# Patient Record
Sex: Female | Born: 1985 | Marital: Married | Smoking: Former smoker
Health system: Southern US, Community
[De-identification: ages and names within clinical notes are randomized; demographics above are authoritative.]

## PROBLEM LIST (undated history)

## (undated) DIAGNOSIS — Z8619 Personal history of other infectious and parasitic diseases: Secondary | ICD-10-CM

## (undated) DIAGNOSIS — F329 Major depressive disorder, single episode, unspecified: Secondary | ICD-10-CM

## (undated) DIAGNOSIS — A499 Bacterial infection, unspecified: Secondary | ICD-10-CM

## (undated) DIAGNOSIS — B379 Candidiasis, unspecified: Secondary | ICD-10-CM

## (undated) DIAGNOSIS — F32A Depression, unspecified: Secondary | ICD-10-CM

## (undated) HISTORY — DX: Bacterial infection, unspecified: A49.9

## (undated) HISTORY — DX: Depression, unspecified: F32.A

## (undated) HISTORY — DX: Candidiasis, unspecified: B37.9

## (undated) HISTORY — PX: WISDOM TOOTH EXTRACTION: SHX21

## (undated) HISTORY — DX: Major depressive disorder, single episode, unspecified: F32.9

## (undated) HISTORY — DX: Personal history of other infectious and parasitic diseases: Z86.19

---

## 2011-01-12 ENCOUNTER — Other Ambulatory Visit (HOSPITAL_COMMUNITY)
Admission: RE | Admit: 2011-01-12 | Discharge: 2011-01-12 | Disposition: A | Discharge status: Home / Self Care | Payer: BC Managed Care – PPO | Source: Ambulatory Visit | Attending: Internal Medicine | Admitting: Internal Medicine

## 2011-01-12 DIAGNOSIS — Z01419 Encounter for gynecological examination (general) (routine) without abnormal findings: Secondary | ICD-10-CM | POA: Insufficient documentation

## 2011-10-28 ENCOUNTER — Other Ambulatory Visit: Payer: Self-pay | Admitting: Obstetrics and Gynecology

## 2011-10-28 ENCOUNTER — Ambulatory Visit (HOSPITAL_COMMUNITY): Payer: BC Managed Care – PPO

## 2011-10-28 ENCOUNTER — Other Ambulatory Visit: Payer: Self-pay

## 2011-10-28 ENCOUNTER — Encounter (INDEPENDENT_AMBULATORY_CARE_PROVIDER_SITE_OTHER): Payer: BC Managed Care – PPO | Admitting: Obstetrics and Gynecology

## 2011-10-28 DIAGNOSIS — Z3689 Encounter for other specified antenatal screening: Secondary | ICD-10-CM

## 2011-10-28 DIAGNOSIS — Z3480 Encounter for supervision of other normal pregnancy, unspecified trimester: Secondary | ICD-10-CM

## 2011-10-29 ENCOUNTER — Encounter (HOSPITAL_COMMUNITY): Payer: Self-pay

## 2011-10-29 ENCOUNTER — Ambulatory Visit (HOSPITAL_COMMUNITY)
Admission: RE | Admit: 2011-10-29 | Discharge: 2011-10-29 | Disposition: A | Discharge status: Home / Self Care | Payer: BC Managed Care – PPO | Source: Ambulatory Visit | Attending: Obstetrics and Gynecology | Admitting: Obstetrics and Gynecology

## 2011-10-29 ENCOUNTER — Ambulatory Visit (HOSPITAL_COMMUNITY): Admission: RE | Admit: 2011-10-29 | Payer: BC Managed Care – PPO | Source: Ambulatory Visit

## 2011-10-29 DIAGNOSIS — O358XX0 Maternal care for other (suspected) fetal abnormality and damage, not applicable or unspecified: Secondary | ICD-10-CM | POA: Insufficient documentation

## 2011-10-29 DIAGNOSIS — Z363 Encounter for antenatal screening for malformations: Secondary | ICD-10-CM | POA: Insufficient documentation

## 2011-10-29 DIAGNOSIS — Z1389 Encounter for screening for other disorder: Secondary | ICD-10-CM | POA: Insufficient documentation

## 2011-10-29 DIAGNOSIS — Z3689 Encounter for other specified antenatal screening: Secondary | ICD-10-CM

## 2011-11-13 DIAGNOSIS — F32A Depression, unspecified: Secondary | ICD-10-CM

## 2011-11-13 DIAGNOSIS — O26851 Spotting complicating pregnancy, first trimester: Secondary | ICD-10-CM | POA: Insufficient documentation

## 2011-11-13 DIAGNOSIS — F329 Major depressive disorder, single episode, unspecified: Secondary | ICD-10-CM | POA: Insufficient documentation

## 2011-11-25 ENCOUNTER — Encounter: Payer: BC Managed Care – PPO | Admitting: Obstetrics and Gynecology

## 2011-11-26 ENCOUNTER — Encounter: Payer: Self-pay | Admitting: Obstetrics and Gynecology

## 2011-11-26 ENCOUNTER — Ambulatory Visit (INDEPENDENT_AMBULATORY_CARE_PROVIDER_SITE_OTHER): Payer: BC Managed Care – PPO | Admitting: Obstetrics and Gynecology

## 2011-11-26 VITALS — BP 122/70 | Ht 64.0 in | Wt 183.0 lb

## 2011-11-26 DIAGNOSIS — Z331 Pregnant state, incidental: Secondary | ICD-10-CM

## 2011-11-26 NOTE — Patient Instructions (Signed)
Fetal Movement Counts Patient Name: __________________________________________________ Patient Due Date: ____________________ Kick counts is highly recommended in high risk pregnancies, but it is a good idea for every pregnant woman to do. Start counting fetal movements at 28 weeks of the pregnancy. Fetal movements increase after eating a full meal or eating or drinking something sweet (the blood sugar is higher). It is also important to drink plenty of fluids (well hydrated) before doing the count. Lie on your left side because it helps with the circulation or you can sit in a comfortable chair with your arms over your belly (abdomen) with no distractions around you. DOING THE COUNT  Try to do the count the same time of day each time you do it.   Mark the day and time, then see how long it takes for you to feel 10 movements (kicks, flutters, swishes, rolls). You should have at least 10 movements within 2 hours. You will most likely feel 10 movements in much less than 2 hours. If you do not, wait an hour and count again. After a couple of days you will see a pattern.   What you are looking for is a change in the pattern or not enough counts in 2 hours. Is it taking longer in time to reach 10 movements?  SEEK MEDICAL CARE IF:  You feel less than 10 counts in 2 hours. Tried twice.   No movement in one hour.   The pattern is changing or taking longer each day to reach 10 counts in 2 hours.   You feel the baby is not moving as it usually does.  Date: ____________ Movements: ____________ Start time: ____________ Finish time: ____________  Date: ____________ Movements: ____________ Start time: ____________ Finish time: ____________ Date: ____________ Movements: ____________ Start time: ____________ Finish time: ____________ Date: ____________ Movements: ____________ Start time: ____________ Finish time: ____________ Date: ____________ Movements: ____________ Start time: ____________ Finish time:  ____________ Date: ____________ Movements: ____________ Start time: ____________ Finish time: ____________ Date: ____________ Movements: ____________ Start time: ____________ Finish time: ____________ Date: ____________ Movements: ____________ Start time: ____________ Finish time: ____________  Date: ____________ Movements: ____________ Start time: ____________ Finish time: ____________ Date: ____________ Movements: ____________ Start time: ____________ Finish time: ____________ Date: ____________ Movements: ____________ Start time: ____________ Finish time: ____________ Date: ____________ Movements: ____________ Start time: ____________ Finish time: ____________ Date: ____________ Movements: ____________ Start time: ____________ Finish time: ____________ Date: ____________ Movements: ____________ Start time: ____________ Finish time: ____________ Date: ____________ Movements: ____________ Start time: ____________ Finish time: ____________  Date: ____________ Movements: ____________ Start time: ____________ Finish time: ____________ Date: ____________ Movements: ____________ Start time: ____________ Finish time: ____________ Date: ____________ Movements: ____________ Start time: ____________ Finish time: ____________ Date: ____________ Movements: ____________ Start time: ____________ Finish time: ____________ Date: ____________ Movements: ____________ Start time: ____________ Finish time: ____________ Date: ____________ Movements: ____________ Start time: ____________ Finish time: ____________ Date: ____________ Movements: ____________ Start time: ____________ Finish time: ____________  Date: ____________ Movements: ____________ Start time: ____________ Finish time: ____________ Date: ____________ Movements: ____________ Start time: ____________ Finish time: ____________ Date: ____________ Movements: ____________ Start time: ____________ Finish time: ____________ Date: ____________ Movements:  ____________ Start time: ____________ Finish time: ____________ Date: ____________ Movements: ____________ Start time: ____________ Finish time: ____________ Date: ____________ Movements: ____________ Start time: ____________ Finish time: ____________ Date: ____________ Movements: ____________ Start time: ____________ Finish time: ____________  Date: ____________ Movements: ____________ Start time: ____________ Finish time: ____________ Date: ____________ Movements: ____________ Start time: ____________ Finish time: ____________ Date: ____________ Movements: ____________ Start time:   ____________ Finish time: ____________ Date: ____________ Movements: ____________ Start time: ____________ Finish time: ____________ Date: ____________ Movements: ____________ Start time: ____________ Finish time: ____________ Date: ____________ Movements: ____________ Start time: ____________ Finish time: ____________ Date: ____________ Movements: ____________ Start time: ____________ Finish time: ____________  Date: ____________ Movements: ____________ Start time: ____________ Finish time: ____________ Date: ____________ Movements: ____________ Start time: ____________ Finish time: ____________ Date: ____________ Movements: ____________ Start time: ____________ Finish time: ____________ Date: ____________ Movements: ____________ Start time: ____________ Finish time: ____________ Date: ____________ Movements: ____________ Start time: ____________ Finish time: ____________ Date: ____________ Movements: ____________ Start time: ____________ Finish time: ____________ Date: ____________ Movements: ____________ Start time: ____________ Finish time: ____________  Date: ____________ Movements: ____________ Start time: ____________ Finish time: ____________ Date: ____________ Movements: ____________ Start time: ____________ Finish time: ____________ Date: ____________ Movements: ____________ Start time: ____________ Finish  time: ____________ Date: ____________ Movements: ____________ Start time: ____________ Finish time: ____________ Date: ____________ Movements: ____________ Start time: ____________ Finish time: ____________ Date: ____________ Movements: ____________ Start time: ____________ Finish time: ____________ Date: ____________ Movements: ____________ Start time: ____________ Finish time: ____________  Date: ____________ Movements: ____________ Start time: ____________ Finish time: ____________ Date: ____________ Movements: ____________ Start time: ____________ Finish time: ____________ Date: ____________ Movements: ____________ Start time: ____________ Finish time: ____________ Date: ____________ Movements: ____________ Start time: ____________ Finish time: ____________ Date: ____________ Movements: ____________ Start time: ____________ Finish time: ____________ Date: ____________ Movements: ____________ Start time: ____________ Finish time: ____________ Document Released: 08/26/2006 Document Revised: 07/16/2011 Document Reviewed: 02/26/2009 ExitCare Patient Information 2012 [employer].  Preventing Preterm Labor Preterm labor is when a pregnant woman has contractions that cause the cervix to open, shorten, and thin before 37 weeks of pregnancy. You will have regular contractions (tightening) 2 to 3 minutes apart. This usually causes discomfort or pain. HOME CARE  Eat a healthy diet.   Take your vitamins as told by your doctor.   Drink enough fluids to keep your pee (urine) clear or pale yellow every day.   Get rest and sleep.   Do not have sex if you are at high risk for preterm labor.   Follow your doctor's advice about activity, medicines, and tests.   Avoid stress.   Avoid hard labor or exercise that lasts for a long time.   Do not smoke.  GET HELP RIGHT AWAY IF:   You are having contractions.   You have belly (abdominal) pain.   You have bleeding from your vagina.   You  have pain when you pee (urinate).   You have abnormal discharge from your vagina.   You have a temperature by mouth above 102 F (38.9 C).  MAKE SURE YOU:  Understand these instructions.   Will watch your condition.   Will get help if you are not doing well or get worse.  Document Released: 10/23/2008 Document Revised: 07/16/2011 Document Reviewed: 10/23/2008 Exit[employer] Patient Information 2012 [employer]. 

## 2011-11-26 NOTE — Progress Notes (Signed)
Mindy [name] [redacted]w[redacted]d   Doing well GFM No ctx, VB or LOF Multiple questions regarding travel plans - rv'd travel precautions, no long distance travel rec after 32wks Pt plans hypnobirthing/waterbirth Desires early d/c from hosp, rec d/w pediatrician rv'd anat [place] from [facility] done on 3-21 S>D with measurements [redacted]w[redacted]d ahead EDC change to 02/25/12  Unable to view DA Anatomy otherwise nl Cx=4.66cm  Plan: f/u anat [place] ASAP 1hr gtt next week ROB 2 weeks rv'd PTL sx's [facility]KC

## 2011-11-30 ENCOUNTER — Other Ambulatory Visit: Payer: BC Managed Care – PPO

## 2011-11-30 ENCOUNTER — Ambulatory Visit (INDEPENDENT_AMBULATORY_CARE_PROVIDER_SITE_OTHER): Payer: BC Managed Care – PPO

## 2011-11-30 ENCOUNTER — Other Ambulatory Visit: Payer: Self-pay | Admitting: Obstetrics and Gynecology

## 2011-11-30 DIAGNOSIS — O26849 Uterine size-date discrepancy, unspecified trimester: Secondary | ICD-10-CM

## 2011-11-30 DIAGNOSIS — O358XX0 Maternal care for other (suspected) fetal abnormality and damage, not applicable or unspecified: Secondary | ICD-10-CM

## 2011-11-30 DIAGNOSIS — Z331 Pregnant state, incidental: Secondary | ICD-10-CM

## 2011-11-30 LAB — US OB FOLLOW UP

## 2011-12-01 LAB — RPR

## 2011-12-01 LAB — GLUCOSE TOLERANCE, 1 HOUR: Glucose, 1 Hour GTT: 143 mg/dL — ABNORMAL HIGH (ref 70–140)

## 2011-12-02 ENCOUNTER — Telehealth: Payer: Self-pay

## 2011-12-02 DIAGNOSIS — O24419 Gestational diabetes mellitus in pregnancy, unspecified control: Secondary | ICD-10-CM

## 2011-12-04 NOTE — Telephone Encounter (Signed)
Spoke with pt informing her 3 hr gtt is needed due to abnormal 1 hr gtt test results per SL. Diet instructions reviewed and emailed per pt's req to [email or web address]. Appt scheduled May 2nd @8am  [facility]. Pt agrees and voices understanding.

## 2011-12-10 ENCOUNTER — Ambulatory Visit (INDEPENDENT_AMBULATORY_CARE_PROVIDER_SITE_OTHER): Payer: BC Managed Care – PPO | Admitting: Obstetrics and Gynecology

## 2011-12-10 ENCOUNTER — Encounter: Payer: Self-pay | Admitting: Obstetrics and Gynecology

## 2011-12-10 ENCOUNTER — Other Ambulatory Visit: Payer: Self-pay | Admitting: Obstetrics and Gynecology

## 2011-12-10 VITALS — BP 120/62 | Wt 187.0 lb

## 2011-12-10 DIAGNOSIS — Z331 Pregnant state, incidental: Secondary | ICD-10-CM | POA: Insufficient documentation

## 2011-12-10 DIAGNOSIS — O321XX0 Maternal care for breech presentation, not applicable or unspecified: Secondary | ICD-10-CM | POA: Insufficient documentation

## 2011-12-10 NOTE — Progress Notes (Signed)
+  2 urine pt just completed 3hr gtt

## 2011-12-10 NOTE — Progress Notes (Signed)
Patient ID: Mindy [name], female   DOB: [birth year], 28 y.o.   MRN: [id number] C/o nasal congestion and sneezing x 2 days Nasal turbinates pink edematous no exudate O Allergies Breech P Reviewed s/s preterm labor, srom, vag bleeding, kick counts to report, enc 8 water daily and frequent voidsm OTC meds reveiwed. 3 gtt today Lavera Guise, CNM

## 2011-12-11 LAB — GLUCOSE TOLERANCE, 3 HOURS
Glucose Tolerance, 1 hour: 137 mg/dL (ref 70–189)
Glucose Tolerance, 2 hour: 156 mg/dL (ref 70–164)
Glucose Tolerance, Fasting: 89 mg/dL (ref 70–104)
Glucose, GTT - 3 Hour: 108 mg/dL (ref 70–144)

## 2011-12-14 ENCOUNTER — Telehealth: Payer: Self-pay | Admitting: Obstetrics and Gynecology

## 2011-12-14 NOTE — Telephone Encounter (Signed)
Results to pull from epic

## 2011-12-15 NOTE — Telephone Encounter (Signed)
TC TO PT WANTING TO KNOW HER 3HR GLUCOSE RESULTS, INFORMED PT ALL WAS WNL, PT VOICES AGREEMENT.

## 2011-12-18 NOTE — Telephone Encounter (Signed)
3hr gtt WNL

## 2011-12-23 ENCOUNTER — Ambulatory Visit (INDEPENDENT_AMBULATORY_CARE_PROVIDER_SITE_OTHER): Payer: BC Managed Care – PPO | Admitting: Obstetrics and Gynecology

## 2011-12-23 ENCOUNTER — Encounter: Payer: Self-pay | Admitting: Obstetrics and Gynecology

## 2011-12-23 VITALS — BP 110/70 | Wt 188.0 lb

## 2011-12-23 DIAGNOSIS — Z331 Pregnant state, incidental: Secondary | ICD-10-CM

## 2011-12-23 DIAGNOSIS — Z3490 Encounter for supervision of normal pregnancy, unspecified, unspecified trimester: Secondary | ICD-10-CM

## 2011-12-23 NOTE — Progress Notes (Signed)
No complaints today per pt.  

## 2011-12-23 NOTE — Progress Notes (Signed)
Doing well. Passed 3 hour GTT.

## 2012-01-05 ENCOUNTER — Encounter: Payer: Self-pay | Admitting: Obstetrics and Gynecology

## 2012-01-05 ENCOUNTER — Ambulatory Visit (INDEPENDENT_AMBULATORY_CARE_PROVIDER_SITE_OTHER): Payer: BC Managed Care – PPO | Admitting: Obstetrics and Gynecology

## 2012-01-05 VITALS — BP 100/64 | Wt 191.0 lb

## 2012-01-05 DIAGNOSIS — O43199 Other malformation of placenta, unspecified trimester: Secondary | ICD-10-CM | POA: Insufficient documentation

## 2012-01-05 DIAGNOSIS — R7309 Other abnormal glucose: Secondary | ICD-10-CM | POA: Insufficient documentation

## 2012-01-05 DIAGNOSIS — O4390 Unspecified placental disorder, unspecified trimester: Secondary | ICD-10-CM

## 2012-01-05 NOTE — Progress Notes (Signed)
[redacted]w[redacted]d GFM Took hypnobirthing and BF Doing yoga Taking WB nxt month rv'd PTL sx's, and FKC  rv'd [place] from 1 mo ago, succenturiate lobe noted, pt informed RTO 2wks

## 2012-01-05 NOTE — Progress Notes (Signed)
Pt has no complaints

## 2012-01-05 NOTE — Patient Instructions (Signed)

## 2012-01-21 ENCOUNTER — Ambulatory Visit (INDEPENDENT_AMBULATORY_CARE_PROVIDER_SITE_OTHER): Payer: BC Managed Care – PPO | Admitting: Obstetrics and Gynecology

## 2012-01-21 ENCOUNTER — Encounter: Payer: Self-pay | Admitting: Obstetrics and Gynecology

## 2012-01-21 VITALS — BP 102/62 | Wt 196.0 lb

## 2012-01-21 DIAGNOSIS — Z331 Pregnant state, incidental: Secondary | ICD-10-CM

## 2012-01-21 DIAGNOSIS — Z3490 Encounter for supervision of normal pregnancy, unspecified, unspecified trimester: Secondary | ICD-10-CM

## 2012-01-21 NOTE — Progress Notes (Signed)
No complaints  Pt voided during check in without leaving sample

## 2012-01-21 NOTE — Progress Notes (Signed)
Doing well.  Discussed GBS--will do NV.

## 2012-02-01 ENCOUNTER — Ambulatory Visit (INDEPENDENT_AMBULATORY_CARE_PROVIDER_SITE_OTHER): Payer: BC Managed Care – PPO

## 2012-02-01 VITALS — BP 90/60 | Wt 198.0 lb

## 2012-02-01 DIAGNOSIS — Z331 Pregnant state, incidental: Secondary | ICD-10-CM

## 2012-02-01 NOTE — Progress Notes (Signed)
[redacted]w[redacted]d; well except feels sluggish today b/c she "ate a lot," yesterday.  Used garlic vaginally past 2 days. GBS today.  Plans natural CB.  GFM.  Rev'd FKC & labor s/s.

## 2012-02-01 NOTE — Progress Notes (Signed)
No concerns. 

## 2012-02-12 ENCOUNTER — Ambulatory Visit (INDEPENDENT_AMBULATORY_CARE_PROVIDER_SITE_OTHER): Payer: BC Managed Care – PPO

## 2012-02-12 VITALS — BP 90/64 | Wt 196.0 lb

## 2012-02-12 DIAGNOSIS — Z3400 Encounter for supervision of normal first pregnancy, unspecified trimester: Secondary | ICD-10-CM

## 2012-02-12 NOTE — Progress Notes (Signed)
No complaints

## 2012-02-14 NOTE — Progress Notes (Signed)
[redacted]w[redacted]d.  Well.  GBS neg.  Rev'd FKC & Labor s/s.  GFM. No questions.

## 2012-02-19 ENCOUNTER — Ambulatory Visit (INDEPENDENT_AMBULATORY_CARE_PROVIDER_SITE_OTHER): Payer: BC Managed Care – PPO | Admitting: Obstetrics and Gynecology

## 2012-02-19 ENCOUNTER — Encounter: Payer: Self-pay | Admitting: Obstetrics and Gynecology

## 2012-02-19 VITALS — BP 102/68 | Wt 197.0 lb

## 2012-02-19 DIAGNOSIS — Z3490 Encounter for supervision of normal pregnancy, unspecified, unspecified trimester: Secondary | ICD-10-CM

## 2012-02-19 DIAGNOSIS — Z331 Pregnant state, incidental: Secondary | ICD-10-CM

## 2012-02-19 NOTE — Progress Notes (Signed)
Doing well. Vtx to [place]. Mother with her at today's visit. Attended WB class--no questions.

## 2012-02-19 NOTE — Progress Notes (Signed)
Pt states no concerns today.   

## 2012-02-24 ENCOUNTER — Encounter: Payer: BC Managed Care – PPO | Admitting: Obstetrics and Gynecology

## 2012-02-26 ENCOUNTER — Ambulatory Visit (INDEPENDENT_AMBULATORY_CARE_PROVIDER_SITE_OTHER): Payer: BC Managed Care – PPO | Admitting: Obstetrics and Gynecology

## 2012-02-26 VITALS — BP 110/70 | Wt 198.0 lb

## 2012-02-26 DIAGNOSIS — O480 Post-term pregnancy: Secondary | ICD-10-CM | POA: Insufficient documentation

## 2012-02-26 NOTE — Progress Notes (Signed)
Declines cx check today

## 2012-02-26 NOTE — Progress Notes (Signed)
[redacted]w[redacted]d Post dates Fm+ Declined SVE for evulation of cx Slight swelling of lower extremities but mild. Wishes to have water-birth - prepared Wished to use Hypn- birth technic Advised of need to do NST next week No change in vaginal secretions ROB x1 wk

## 2012-03-02 ENCOUNTER — Other Ambulatory Visit: Payer: BC Managed Care – PPO

## 2012-03-02 ENCOUNTER — Encounter: Payer: BC Managed Care – PPO | Admitting: Obstetrics and Gynecology

## 2012-03-04 ENCOUNTER — Encounter: Payer: BC Managed Care – PPO | Admitting: Obstetrics and Gynecology

## 2012-03-07 ENCOUNTER — Ambulatory Visit (INDEPENDENT_AMBULATORY_CARE_PROVIDER_SITE_OTHER): Payer: BC Managed Care – PPO | Admitting: Obstetrics and Gynecology

## 2012-03-07 ENCOUNTER — Encounter: Payer: Self-pay | Admitting: Obstetrics and Gynecology

## 2012-03-07 VITALS — BP 112/62 | Wt 194.0 lb

## 2012-03-07 DIAGNOSIS — O480 Post-term pregnancy: Secondary | ICD-10-CM

## 2012-03-07 NOTE — Progress Notes (Signed)
[redacted]w[redacted]d:

## 2012-03-07 NOTE — Progress Notes (Signed)
cx check. Mw,cma

## 2012-03-07 NOTE — Progress Notes (Signed)
[redacted]w[redacted]d Patient is now post- dates and did not want to have IOL  NST today - NST Result: reactive cat 1 accels with FM+ , Baseline 130 bpm Check USS report of March 2013 - dating from this USS and Best EDD 02/25/12 Patient had concerns that dating was inaccurate. SVE: 3/90%/0 anterior and soft membranes felt, Vx presentation. SVR

## 2012-03-10 ENCOUNTER — Other Ambulatory Visit: Payer: Self-pay

## 2012-03-10 ENCOUNTER — Ambulatory Visit (INDEPENDENT_AMBULATORY_CARE_PROVIDER_SITE_OTHER): Payer: BC Managed Care – PPO | Admitting: Obstetrics and Gynecology

## 2012-03-10 ENCOUNTER — Other Ambulatory Visit: Payer: Self-pay | Admitting: Obstetrics and Gynecology

## 2012-03-10 ENCOUNTER — Ambulatory Visit: Payer: BC Managed Care – PPO

## 2012-03-10 ENCOUNTER — Encounter: Payer: Self-pay | Admitting: Obstetrics and Gynecology

## 2012-03-10 ENCOUNTER — Telehealth: Payer: Self-pay | Admitting: Obstetrics and Gynecology

## 2012-03-10 VITALS — BP 120/80 | Wt 195.0 lb

## 2012-03-10 DIAGNOSIS — O321XX0 Maternal care for breech presentation, not applicable or unspecified: Secondary | ICD-10-CM

## 2012-03-10 DIAGNOSIS — O480 Post-term pregnancy: Secondary | ICD-10-CM

## 2012-03-10 NOTE — Telephone Encounter (Signed)
Primary C/S scheduled for 03/11/12 @ 5:45p with AR/MK. -Adrianne Pridgen

## 2012-03-10 NOTE — Progress Notes (Signed)
Discuss  Ultrasound baby is now frank breach pt is upset.

## 2012-03-10 NOTE — H&P (Signed)
  Mindy [name] is a 26 y.o. female, G1P1 at 28 w1d, presenting for External Version with possible need for Primary C/S if Version of [place] is unsuccessful.  Patient Active Problem List  Diagnosis  . Depression  . Spotting in first trimester  . Normal pregnancy, incidental  . 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 Abnormal GTT (glucose tolerance test) - 1hr elevated, 3hr normal  . Post-dates pregnancy  . Frank breech    History of present pregnancy: Patient entered care at 8 weeks.  EDC of 7/18/13was established by USS.  Anatomy scan was done at 19 weeks, with normal findings and an posterior placenta.  Further ultrasounds were done at 42weeks, with [facility] Breech presentation .  Her prenatal course was essentially uncomplicated.  Further signficant events were unwilling to have induction and had desired a water birth. The patient has been very traumatized by the discovery of breech presentation as all her birthing plans and desires have been changed. She desires Skin to Skin asap after delivery. At her last evalution, she was [redacted]w[redacted]d, Homero Fellers Breech confirmed by USS with BPP of 8/8 and AFI 14.89. .  OB History    Grav Para Term Preterm Abortions TAB SAB Ect Mult Living   1              Past Medical History  Diagnosis Date  . Asthma   . History of chicken pox   . Yeast infection   . Bacterial infection   . Abnormal Pap smear 2012  . Depression    Past Surgical History  Procedure Date  . Wisdom tooth extraction    Family History: family history is not on file. Social History:  reports that she has never smoked. She has never used smokeless tobacco. She reports that she does not drink alcohol or use illicit drugs.  ROS:  All with normal limits    Last menstrual period 06/17/2011.  Chest clear Heart RRR without murmur Abd gravid, NT, FH GI: Normal Pelvic: 3/80%/-2, Breech GU: Normal  FHR: 160 UCs:  Braxton Hicks regular Prenatal labs: ABO,  Rh:  O Pos+ Antibody:   none Rubella: immune RPR: NON REAC (04/22 1610)  HBsAg:   neg HIV:   neg GBS: NEGATIVE (06/24 0941) Sickle cell/Hgb electrophoresis:  normal Pap:  Normal GC:  neg Chlamydia:  neg Genetic screenings:  declined Glucola:  3hr normal       Assessment/Plan: IUP at [redacted]w[redacted]d   Plan: Admission to Hospital at 7.00hrs with scheduled External Version at 08.00hrs. Scheduled Primary C/s at 17.45hrs if EV unsuccessful. The patient has requested that the placenta could be given to the family to freeze dry for post partum nutrients. Please inform Dr Su Hilt of this request.  Earl Gala, CNM, [place] 03/10/2012, 6:40 PM

## 2012-03-10 NOTE — Telephone Encounter (Signed)
ECV scheduled for 03/11/12 @ 8:00 with AR/MK. -Adrianne Pridgen

## 2012-03-10 NOTE — Progress Notes (Signed)
[redacted]w[redacted]d USS today BPP Result:  Homero Fellers Breech presentation. Placenta posterior. AFI 14.89, FHT's 160. BPP 8/8 Discussed the option available to the patient as presentation Homero Fellers Breech. Patient requested a chance at External Version prior to Primary C/s Discussed with Dr Su Hilt. Dr Su Hilt is agreeable to attempt EV. Scheduled EV at 8.00hrs. If the EV fails, Scheduled C/S at 17.45 hrs with Dr Su Hilt. Patient has been advised to fast from midnight. Patient is to present at admissions at 07.00hrs. The patient was very upset about the presentation of the baby. Counseled re risks of EX and counseled re risks of breech vaginal delivery.  The patient is agreeable to the plan of care of initial EV and possible need for Primary C/S in the evening as scheduled.

## 2012-03-11 ENCOUNTER — Encounter (HOSPITAL_COMMUNITY)
Admission: AD | Disposition: A | Discharge status: Home / Self Care | Payer: Self-pay | Source: Ambulatory Visit | Attending: Obstetrics and Gynecology

## 2012-03-11 ENCOUNTER — Inpatient Hospital Stay (HOSPITAL_COMMUNITY)
Admission: AD | Admit: 2012-03-11 | Discharge: 2012-03-13 | DRG: 371 | Disposition: A | Discharge status: Home / Self Care | Payer: BC Managed Care – PPO | Source: Ambulatory Visit | Attending: Obstetrics and Gynecology | Admitting: Obstetrics and Gynecology

## 2012-03-11 ENCOUNTER — Encounter (HOSPITAL_COMMUNITY): Payer: Self-pay

## 2012-03-11 ENCOUNTER — Inpatient Hospital Stay (HOSPITAL_COMMUNITY): Payer: BC Managed Care – PPO

## 2012-03-11 ENCOUNTER — Encounter: Payer: Self-pay | Admitting: Obstetrics and Gynecology

## 2012-03-11 ENCOUNTER — Inpatient Hospital Stay (HOSPITAL_COMMUNITY): Admission: RE | Admit: 2012-03-11 | Payer: BC Managed Care – PPO | Source: Ambulatory Visit

## 2012-03-11 ENCOUNTER — Inpatient Hospital Stay (HOSPITAL_COMMUNITY): Admission: AD | Admit: 2012-03-11 | Payer: BC Managed Care – PPO | Source: Ambulatory Visit | Admitting: Pediatrics

## 2012-03-11 ENCOUNTER — Encounter (HOSPITAL_COMMUNITY): Payer: Self-pay | Admitting: *Deleted

## 2012-03-11 DIAGNOSIS — R7309 Other abnormal glucose: Secondary | ICD-10-CM

## 2012-03-11 DIAGNOSIS — O43109 Malformation of placenta, unspecified, unspecified trimester: Secondary | ICD-10-CM | POA: Diagnosis present

## 2012-03-11 DIAGNOSIS — O321XX0 Maternal care for breech presentation, not applicable or unspecified: Principal | ICD-10-CM | POA: Diagnosis present

## 2012-03-11 DIAGNOSIS — Z98891 History of uterine scar from previous surgery: Secondary | ICD-10-CM

## 2012-03-11 DIAGNOSIS — O43199 Other malformation of placenta, unspecified trimester: Secondary | ICD-10-CM

## 2012-03-11 LAB — CBC
HCT: 38.6 % (ref 36.0–46.0)
Hemoglobin: 12.8 g/dL (ref 12.0–15.0)
MCH: 28.1 pg (ref 26.0–34.0)
MCHC: 33.2 g/dL (ref 30.0–36.0)

## 2012-03-11 LAB — POCT FERN TEST: Fern Test: NEGATIVE

## 2012-03-11 LAB — AMNISURE RUPTURE OF MEMBRANE (ROM) NOT AT ARMC: Amnisure ROM: POSITIVE

## 2012-03-11 SURGERY — Surgical Case
Anesthesia: Regional

## 2012-03-11 SURGERY — Surgical Case
Anesthesia: Epidural | Site: Abdomen | Wound class: Clean Contaminated

## 2012-03-11 MED ORDER — LACTATED RINGERS IV SOLN
INTRAVENOUS | Status: DC
  Administered 2012-03-11: 16:00:00 via INTRAVENOUS

## 2012-03-11 MED ORDER — CITRIC ACID-SODIUM CITRATE 334-500 MG/5ML PO SOLN
30.0000 mL | Freq: Once | ORAL | Status: AC
  Administered 2012-03-11: 30 mL via ORAL
  Filled 2012-03-11: qty 15

## 2012-03-11 MED ORDER — FENTANYL CITRATE 0.05 MG/ML IJ SOLN
INTRAMUSCULAR | Status: AC
  Filled 2012-03-11: qty 2

## 2012-03-11 MED ORDER — SIMETHICONE 80 MG PO CHEW
80.0000 mg | CHEWABLE_TABLET | Freq: Three times a day (TID) | ORAL | Status: DC
  Administered 2012-03-11 – 2012-03-13 (×5): 80 mg via ORAL

## 2012-03-11 MED ORDER — PHENYLEPHRINE 40 MCG/ML (10ML) SYRINGE FOR IV PUSH (FOR BLOOD PRESSURE SUPPORT)
PREFILLED_SYRINGE | INTRAVENOUS | Status: AC
  Filled 2012-03-11: qty 5

## 2012-03-11 MED ORDER — FENTANYL CITRATE 0.05 MG/ML IJ SOLN
INTRAMUSCULAR | Status: DC | PRN
  Administered 2012-03-11: 25 ug via INTRATHECAL

## 2012-03-11 MED ORDER — KETOROLAC TROMETHAMINE 30 MG/ML IJ SOLN
30.0000 mg | Freq: Four times a day (QID) | INTRAMUSCULAR | Status: AC | PRN
  Administered 2012-03-11: 30 mg via INTRAVENOUS
  Filled 2012-03-11: qty 1

## 2012-03-11 MED ORDER — ONDANSETRON HCL 4 MG PO TABS: 4.0000 mg | ORAL_TABLET | ORAL | Status: DC | PRN

## 2012-03-11 MED ORDER — KETOROLAC TROMETHAMINE 60 MG/2ML IM SOLN
60.0000 mg | Freq: Once | INTRAMUSCULAR | Status: AC | PRN
  Administered 2012-03-11: 60 mg via INTRAMUSCULAR

## 2012-03-11 MED ORDER — LACTATED RINGERS IV SOLN
INTRAVENOUS | Status: DC
  Administered 2012-03-11 (×3): via INTRAVENOUS

## 2012-03-11 MED ORDER — IBUPROFEN 600 MG PO TABS
600.0000 mg | ORAL_TABLET | Freq: Four times a day (QID) | ORAL | Status: DC
  Administered 2012-03-11 – 2012-03-13 (×6): 600 mg via ORAL
  Filled 2012-03-11 (×2): qty 1

## 2012-03-11 MED ORDER — DIPHENHYDRAMINE HCL 50 MG/ML IJ SOLN: 12.5000 mg | INTRAMUSCULAR | Status: DC | PRN

## 2012-03-11 MED ORDER — IBUPROFEN 600 MG PO TABS
600.0000 mg | ORAL_TABLET | Freq: Four times a day (QID) | ORAL | Status: DC | PRN
  Filled 2012-03-11 (×5): qty 1

## 2012-03-11 MED ORDER — OXYTOCIN 10 UNIT/ML IJ SOLN
INTRAMUSCULAR | Status: AC
  Filled 2012-03-11: qty 4

## 2012-03-11 MED ORDER — ONDANSETRON HCL 4 MG/2ML IJ SOLN: 4.0000 mg | INTRAMUSCULAR | Status: DC | PRN

## 2012-03-11 MED ORDER — OXYTOCIN 40 UNITS IN LACTATED RINGERS INFUSION - SIMPLE MED: 62.5000 mL/h | INTRAVENOUS | Status: AC

## 2012-03-11 MED ORDER — MORPHINE SULFATE (PF) 0.5 MG/ML IJ SOLN
INTRAMUSCULAR | Status: DC | PRN
  Administered 2012-03-11: .15 mg via INTRATHECAL

## 2012-03-11 MED ORDER — KETOROLAC TROMETHAMINE 60 MG/2ML IM SOLN
INTRAMUSCULAR | Status: AC
  Filled 2012-03-11: qty 2

## 2012-03-11 MED ORDER — ZOLPIDEM TARTRATE 5 MG PO TABS: 5.0000 mg | ORAL_TABLET | Freq: Every evening | ORAL | Status: DC | PRN

## 2012-03-11 MED ORDER — BUPIVACAINE HCL (PF) 0.25 % IJ SOLN
INTRAMUSCULAR | Status: DC | PRN
  Administered 2012-03-11: 10 mL

## 2012-03-11 MED ORDER — NALBUPHINE HCL 10 MG/ML IJ SOLN
5.0000 mg | INTRAMUSCULAR | Status: DC | PRN
  Filled 2012-03-11 (×3): qty 1

## 2012-03-11 MED ORDER — PRENATAL MULTIVITAMIN CH
1.0000 | ORAL_TABLET | Freq: Every day | ORAL | Status: DC
  Administered 2012-03-11 – 2012-03-13 (×2): 1 via ORAL
  Filled 2012-03-11 (×3): qty 1

## 2012-03-11 MED ORDER — HYDROMORPHONE HCL PF 1 MG/ML IJ SOLN: 0.2500 mg | INTRAMUSCULAR | Status: DC | PRN

## 2012-03-11 MED ORDER — TETANUS-DIPHTH-ACELL PERTUSSIS 5-2.5-18.5 LF-MCG/0.5 IM SUSP
0.5000 mL | Freq: Once | INTRAMUSCULAR | Status: AC
  Administered 2012-03-12: 0.5 mL via INTRAMUSCULAR
  Filled 2012-03-11: qty 0.5

## 2012-03-11 MED ORDER — NALBUPHINE SYRINGE 5 MG/0.5 ML
INJECTION | INTRAMUSCULAR | Status: AC
  Filled 2012-03-11: qty 0.5

## 2012-03-11 MED ORDER — SODIUM CHLORIDE 0.9 % IV SOLN
1.0000 ug/kg/h | INTRAVENOUS | Status: DC | PRN
  Filled 2012-03-11: qty 2.5

## 2012-03-11 MED ORDER — NALBUPHINE SYRINGE 5 MG/0.5 ML
5.0000 mg | INJECTION | INTRAMUSCULAR | Status: DC | PRN
  Administered 2012-03-11: 5 mg via INTRAVENOUS
  Administered 2012-03-11 (×2): 10 mg via INTRAVENOUS
  Filled 2012-03-11: qty 1

## 2012-03-11 MED ORDER — OXYTOCIN 10 UNIT/ML IJ SOLN
40.0000 [IU] | INTRAVENOUS | Status: DC | PRN
  Administered 2012-03-11: 40 [IU] via INTRAVENOUS

## 2012-03-11 MED ORDER — SCOPOLAMINE 1 MG/3DAYS TD PT72
1.0000 | MEDICATED_PATCH | Freq: Once | TRANSDERMAL | Status: DC
  Administered 2012-03-11: 1.5 mg via TRANSDERMAL

## 2012-03-11 MED ORDER — MORPHINE SULFATE 0.5 MG/ML IJ SOLN
INTRAMUSCULAR | Status: AC
  Filled 2012-03-11: qty 10

## 2012-03-11 MED ORDER — SIMETHICONE 80 MG PO CHEW: 80.0000 mg | CHEWABLE_TABLET | ORAL | Status: DC | PRN

## 2012-03-11 MED ORDER — KETOROLAC TROMETHAMINE 30 MG/ML IJ SOLN: 30.0000 mg | Freq: Four times a day (QID) | INTRAMUSCULAR | Status: AC | PRN

## 2012-03-11 MED ORDER — METOCLOPRAMIDE HCL 5 MG/ML IJ SOLN: 10.0000 mg | Freq: Three times a day (TID) | INTRAMUSCULAR | Status: DC | PRN

## 2012-03-11 MED ORDER — DIPHENHYDRAMINE HCL 50 MG/ML IJ SOLN: 25.0000 mg | INTRAMUSCULAR | Status: DC | PRN

## 2012-03-11 MED ORDER — CEFAZOLIN SODIUM-DEXTROSE 2-3 GM-% IV SOLR
2.0000 g | Freq: Once | INTRAVENOUS | Status: AC
  Administered 2012-03-11: 2 g via INTRAVENOUS
  Filled 2012-03-11: qty 50

## 2012-03-11 MED ORDER — MENTHOL 3 MG MT LOZG: 1.0000 | LOZENGE | OROMUCOSAL | Status: DC | PRN

## 2012-03-11 MED ORDER — NALOXONE HCL 0.4 MG/ML IJ SOLN: 0.4000 mg | INTRAMUSCULAR | Status: DC | PRN

## 2012-03-11 MED ORDER — ONDANSETRON HCL 4 MG/2ML IJ SOLN
INTRAMUSCULAR | Status: DC | PRN
  Administered 2012-03-11: 4 mg via INTRAVENOUS

## 2012-03-11 MED ORDER — MEPERIDINE HCL 25 MG/ML IJ SOLN: 6.2500 mg | INTRAMUSCULAR | Status: DC | PRN

## 2012-03-11 MED ORDER — PHENYLEPHRINE HCL 10 MG/ML IJ SOLN
INTRAMUSCULAR | Status: DC | PRN
  Administered 2012-03-11: 80 ug via INTRAVENOUS

## 2012-03-11 MED ORDER — DIPHENHYDRAMINE HCL 25 MG PO CAPS
25.0000 mg | ORAL_CAPSULE | ORAL | Status: DC | PRN
  Administered 2012-03-12: 25 mg via ORAL
  Filled 2012-03-11: qty 1

## 2012-03-11 MED ORDER — DIBUCAINE 1 % RE OINT: 1.0000 "application " | TOPICAL_OINTMENT | RECTAL | Status: DC | PRN

## 2012-03-11 MED ORDER — ONDANSETRON HCL 4 MG/2ML IJ SOLN
INTRAMUSCULAR | Status: AC
  Filled 2012-03-11: qty 2

## 2012-03-11 MED ORDER — ONDANSETRON HCL 4 MG/2ML IJ SOLN: 4.0000 mg | Freq: Three times a day (TID) | INTRAMUSCULAR | Status: DC | PRN

## 2012-03-11 MED ORDER — LANOLIN HYDROUS EX OINT: 1.0000 "application " | TOPICAL_OINTMENT | CUTANEOUS | Status: DC | PRN

## 2012-03-11 MED ORDER — DIPHENHYDRAMINE HCL 25 MG PO CAPS: 25.0000 mg | ORAL_CAPSULE | Freq: Four times a day (QID) | ORAL | Status: DC | PRN

## 2012-03-11 MED ORDER — WITCH HAZEL-GLYCERIN EX PADS: 1.0000 "application " | MEDICATED_PAD | CUTANEOUS | Status: DC | PRN

## 2012-03-11 MED ORDER — SCOPOLAMINE 1 MG/3DAYS TD PT72
MEDICATED_PATCH | TRANSDERMAL | Status: AC
  Filled 2012-03-11: qty 1

## 2012-03-11 MED ORDER — OXYCODONE-ACETAMINOPHEN 5-325 MG PO TABS
1.0000 | ORAL_TABLET | ORAL | Status: DC | PRN
Start: 2012-03-11 — End: 2012-03-13
  Administered 2012-03-11 – 2012-03-12 (×4): 1 via ORAL
  Administered 2012-03-12: 2 via ORAL
  Administered 2012-03-12 – 2012-03-13 (×2): 1 via ORAL
  Filled 2012-03-11 (×5): qty 1
  Filled 2012-03-11 (×2): qty 2

## 2012-03-11 MED ORDER — SODIUM CHLORIDE 0.9 % IJ SOLN: 3.0000 mL | INTRAMUSCULAR | Status: DC | PRN

## 2012-03-11 MED ORDER — SENNOSIDES-DOCUSATE SODIUM 8.6-50 MG PO TABS
2.0000 | ORAL_TABLET | Freq: Every day | ORAL | Status: DC
  Administered 2012-03-11 – 2012-03-12 (×2): 2 via ORAL

## 2012-03-11 SURGICAL SUPPLY — 46 items
BENZOIN TINCTURE PRP APPL 2/3 (GAUZE/BANDAGES/DRESSINGS) IMPLANT
BLADE EXTENDED COATED 6.5IN (ELECTRODE) IMPLANT
BLADE HEX COATED 2.75 (ELECTRODE) IMPLANT
BOOTIES KNEE HIGH SLOAN (MISCELLANEOUS) ×4 IMPLANT
CHLORAPREP W/TINT 26ML (MISCELLANEOUS) ×2 IMPLANT
CLOTH BEACON ORANGE TIMEOUT ST (SAFETY) ×2 IMPLANT
CONTAINER PREFILL 10% NBF 15ML (MISCELLANEOUS) ×4 IMPLANT
DERMABOND ADVANCED (GAUZE/BANDAGES/DRESSINGS)
DERMABOND ADVANCED .7 DNX12 (GAUZE/BANDAGES/DRESSINGS) IMPLANT
DRAIN JACKSON PRT FLT 7MM (DRAIN) IMPLANT
DRESSING TELFA 8X3 (GAUZE/BANDAGES/DRESSINGS) IMPLANT
DRSG COVADERM 4X10 (GAUZE/BANDAGES/DRESSINGS) ×2 IMPLANT
ELECT REM PT RETURN 9FT ADLT (ELECTROSURGICAL) ×2
ELECTRODE REM PT RTRN 9FT ADLT (ELECTROSURGICAL) ×1 IMPLANT
EVACUATOR SILICONE 100CC (DRAIN) IMPLANT
EXTRACTOR VACUUM KIWI (MISCELLANEOUS) IMPLANT
EXTRACTOR VACUUM M CUP 4 TUBE (SUCTIONS) IMPLANT
GAUZE SPONGE 4X4 12PLY STRL LF (GAUZE/BANDAGES/DRESSINGS) ×4 IMPLANT
GLOVE SURG SS PI 6.5 STRL IVOR (GLOVE) ×4 IMPLANT
GOWN PREVENTION PLUS LG XLONG (DISPOSABLE) ×6 IMPLANT
KIT ABG SYR 3ML LUER SLIP (SYRINGE) IMPLANT
NEEDLE HYPO 25X5/8 SAFETYGLIDE (NEEDLE) IMPLANT
NEEDLE SPNL 22GX3.5 QUINCKE BK (NEEDLE) ×2 IMPLANT
NS IRRIG 1000ML POUR BTL (IV SOLUTION) ×2 IMPLANT
PACK C SECTION WH (CUSTOM PROCEDURE TRAY) ×2 IMPLANT
PAD ABD 7.5X8 STRL (GAUZE/BANDAGES/DRESSINGS) ×2 IMPLANT
SLEEVE SCD COMPRESS KNEE MED (MISCELLANEOUS) ×2 IMPLANT
STRIP CLOSURE SKIN 1/4X4 (GAUZE/BANDAGES/DRESSINGS) IMPLANT
SUT CHROMIC 2 0 SH (SUTURE) ×2 IMPLANT
SUT MNCRL AB 3-0 PS2 27 (SUTURE) ×2 IMPLANT
SUT SILK 0 FSL (SUTURE) IMPLANT
SUT VIC AB 0 CT1 27 (SUTURE) ×2
SUT VIC AB 0 CT1 27XBRD ANBCTR (SUTURE) ×2 IMPLANT
SUT VIC AB 0 CT1 36 (SUTURE) IMPLANT
SUT VIC AB 0 CTX 36 (SUTURE) ×2
SUT VIC AB 0 CTX36XBRD ANBCTRL (SUTURE) ×2 IMPLANT
SUT VIC AB 0 CTXB 36 (SUTURE) IMPLANT
SUT VIC AB 2-0 CT1 27 (SUTURE) ×1
SUT VIC AB 2-0 CT1 TAPERPNT 27 (SUTURE) ×1 IMPLANT
SUT VIC AB 2-0 SH 27 (SUTURE)
SUT VIC AB 2-0 SH 27XBRD (SUTURE) IMPLANT
SYR CONTROL 10ML LL (SYRINGE) ×2 IMPLANT
TAPE CLOTH SURG 4X10 WHT LF (GAUZE/BANDAGES/DRESSINGS) ×2 IMPLANT
TOWEL OR 17X24 6PK STRL BLUE (TOWEL DISPOSABLE) ×4 IMPLANT
TRAY FOLEY CATH 14FR (SET/KITS/TRAYS/PACK) ×2 IMPLANT
WATER STERILE IRR 1000ML POUR (IV SOLUTION) IMPLANT

## 2012-03-11 NOTE — MAU Note (Signed)
Pt reports ROM at 0200, some tightness

## 2012-03-11 NOTE — Anesthesia Postprocedure Evaluation (Signed)
Anesthesia Post Note  Patient: Mindy [name]  Procedure(s) Performed: Procedure(s) (LRB): CESAREAN SECTION (N/A)  Anesthesia type: Spinal  Patient location: PACU  Post pain: Pain level controlled  Post assessment: Post-op Vital signs reviewed  Last Vitals:  Filed Vitals:   03/11/12 0830  BP: 112/61  Pulse: 64  Temp:   Resp: 14    Post vital signs: Reviewed  Level of consciousness: awake  Complications: No apparent anesthesia complications

## 2012-03-11 NOTE — Anesthesia Preprocedure Evaluation (Signed)
Anesthesia Evaluation  Patient identified by MRN, date of birth, ID band Patient awake    Reviewed: Allergy & Precautions, H&P , Patient's Chart, lab work & pertinent test results  Airway Mallampati: II TM Distance: >3 FB Neck ROM: full    Dental No notable dental hx.    Pulmonary asthma ,  breath sounds clear to auscultation  Pulmonary exam normal       Cardiovascular Exercise Tolerance: Good Rhythm:regular Rate:Normal     Neuro/Psych    GI/Hepatic   Endo/Other    Renal/GU      Musculoskeletal   Abdominal   Peds  Hematology   Anesthesia Other Findings   Reproductive/Obstetrics                           Anesthesia Physical Anesthesia Plan  ASA: II  Anesthesia Plan: Epidural and Spinal   Post-op Pain Management:    Induction:   Airway Management Planned:   Additional Equipment:   Intra-op Plan:   Post-operative Plan:   Informed Consent: I have reviewed the patients History and Physical, chart, labs and discussed the procedure including the risks, benefits and alternatives for the proposed anesthesia with the patient or authorized representative who has indicated his/her understanding and acceptance.   Dental Advisory Given  Plan Discussed with: CRNA  Anesthesia Plan Comments: (Labs checked- platelets confirmed with RN in room.  Discussed spinal, and patient consents to the procedure:  included risk of possible headache,backache, failed block, allergic reaction, and nerve injury. This patient was asked if she had any questions or concerns before the procedure started. )        Anesthesia Quick Evaluation

## 2012-03-11 NOTE — Progress Notes (Signed)
S: Pt is [redacted]w[redacted]d, reports SROM at 0200, w several small gushes of clear fluid and then more fluid a while later, unaware of ctx, but feels some "pressure"  pt was frank breech on [place] office today with, normal AFI and BPP 8/8 scheduled for ECV at 830am, desires vaginal delivery, asked about a breech vaginal delivery   O: BSUS breech?  VE= 3/60/-1,  amnisure pos  A: IUP at [redacted]w[redacted]d BP slightly elevated  P: will get formal US to confirm position and check AFI

## 2012-03-11 NOTE — Anesthesia Procedure Notes (Signed)

## 2012-03-11 NOTE — Anesthesia Postprocedure Evaluation (Signed)
Anesthesia Post Note  Patient: Mindy [name]  Procedure(s) Performed: Procedure(s) (LRB): CESAREAN SECTION (N/A)  Anesthesia type: sab  Patient location: PACU  Post pain: Pain level controlled  Post assessment: Post-op Vital signs reviewed  Last Vitals:  Filed Vitals:   03/11/12 0830  BP: 112/61  Pulse: 64  Temp:   Resp: 14    Post vital signs: Reviewed  Level of consciousness: awake  Complications: No apparent anesthesia complications

## 2012-03-11 NOTE — H&P (Signed)
Mindy [name] is a 26 y.o. female presenting for SROM at 0200 of clear fluid. Denies reg ctx, no VB, GFM.   History of present pregnancy:  Patient entered care at 8 weeks. EDC of 7/18/13was established by USS. Anatomy scan was done at 19 weeks, with normal findings and an posterior placenta. Further ultrasounds were done at 42weeks, with [facility] Breech presentation . Her prenatal course was essentially uncomplicated. Further signficant events were unwilling to have induction and had desired a water birth. The patient has been very traumatized by the discovery of breech presentation as all her birthing plans and desires have been changed. She desires Skin to Skin asap after delivery.  At her last evalution, she was [redacted]w[redacted]d, Homero Fellers Breech confirmed by USS with BPP of 8/8 and AFI 14.89.  Maternal Medical History:  Reason for admission: Reason for admission: rupture of membranes and contractions.  Contractions: Onset was 3-5 hours ago.   Frequency: irregular.   Duration is approximately 60 seconds.   Perceived severity is mild.    Fetal activity: Perceived fetal activity is normal.   Last perceived fetal movement was within the past hour.    Prenatal complications: no prenatal complications   OB History    Grav Para Term Preterm Abortions TAB SAB Ect Mult Living   1              Past Medical History  Diagnosis Date  . Asthma   . History of chicken pox   . Yeast infection   . Bacterial infection   . Abnormal Pap smear 2012  . Depression    Past Surgical History  Procedure Date  . Wisdom tooth extraction    Family History: family history is not on file. Social History:  reports that she has never smoked. She has never used smokeless tobacco. She reports that she does not drink alcohol or use illicit drugs.   Prenatal Transfer Tool  Maternal Diabetes: No Genetic Screening: Declined Maternal Ultrasounds/Referrals: Normal Fetal Ultrasounds or other Referrals:  None Maternal  Substance Abuse:  No Significant Maternal Medications:  None Significant Maternal Lab Results:  None Other Comments:  [redacted]w[redacted]d - breech   Review of Systems  All other systems reviewed and are negative.      Blood pressure 132/88, pulse 80, temperature 97.9 F (36.6 C), temperature source Oral, resp. rate 16, height 5' 3.75" (1.619 m), weight 198 lb (89.812 kg), last menstrual period 06/17/2011, SpO2 100.00%. Maternal Exam:  Uterine Assessment: Contraction strength is mild.  Contraction duration is 60 seconds. Contraction frequency is irregular.   Abdomen: Patient reports no abdominal tenderness. Fundal height is aga.   Estimated fetal weight is 7-8.   Fetal presentation: vertex  Introitus: Normal vulva. Vagina is positive for vaginal discharge.  Amniotic fluid character: clear. Positive amnisure   Pelvis: adequate for delivery.   Cervix: Cervix evaluated by digital exam.     Fetal Exam Fetal Monitor Review: Mode: ultrasound.   Baseline rate: 145.  Variability: moderate (6-25 bpm).   Pattern: accelerations present and no decelerations.    Fetal State Assessment: Category I - tracings are normal.     Physical Exam  Nursing note and vitals reviewed. Constitutional: She is oriented to person, place, and time. She appears well-developed and well-nourished.       Tearful secondary to needing c-section  HENT:  Head: Normocephalic.  Eyes: Pupils are equal, round, and reactive to light.  Neck: Normal range of motion.  Cardiovascular: Normal rate and normal  heart sounds.   Respiratory: Effort normal and breath sounds normal.  GI: Soft. Bowel sounds are normal.  Genitourinary: Vaginal discharge found.       sm amt clear fluid  Musculoskeletal: Normal range of motion. She exhibits edema.       Mild pedal edema   Neurological: She is alert and oriented to person, place, and time.  Skin: Skin is warm and dry.  Psychiatric: She has a normal mood and affect. Her behavior is  normal.    Prenatal labs: ABO, Rh:  O pos Antibody:  neg Rubella:  IMM RPR: NON REAC (04/22 1610)  HBsAg:   neg HIV:   neg GBS: NEGATIVE (06/24 0941)   Assessment/Plan: IUP at [redacted]w[redacted]d Breech presentation GBS neg FHR reassuring Formal US per sonographer confirms breech and AFI is low - rv'd findings w Dr Pennie Rushing and her recommendation is to proceed w cesarean this morning.   Admit to preop Routine preop orders Rv'd risk of c-section w pt including infection, bleeding, anesthesia complications or damage to internal organs, pt and sig other appear to understand these risks and agree to proceed.     Abril Cappiello M 03/11/2012, 5:20 AM

## 2012-03-11 NOTE — Op Note (Signed)
Cesarean Section Procedure Note  Indications: malpresentation: breech  Pre-operative Diagnosis: 42 week 1 day pregnancy.  Post-operative Diagnosis: same  Surgeon: Hal Morales  First Assistant:  Surgeon: Hal Morales   Assistants: Murriel Hopper certified nurse midwife  Anesthesia: Spinal anesthesia  ASA Class: 2  Procedure Details  The patient was seen in the Holding Room. The risks, benefits, complications, treatment options, and expected outcomes were discussed with the patient.  The patient concurred with the proposed plan, giving informed consent.  The site of surgery properly noted/marked. The patient was taken to Operating Room # 1, identified as Mindy [name] and the procedure verified as C-Section Delivery. A Time Out was held and the above information confirmed.  After induction of anesthesia, the patient was  prepped with Chloraprep in the usual sterile manner.A foley catheter was placed under sterile conditions.  The patient was then draped in the usual fashion.   Suprapubicsubcutaneous injection of 0.25% Bupivacaine   A Pfannenstiel incision was made and carried down through the subcutaneous tissue to the fascia. Fascial incision was made and extended transversely. The fascia was separated from the underlying rectus tissue superiorly and inferiorly. The peritoneum was identified and entered. Peritoneal incision was extended longitudinally.  The bladder blade was placed.   A low transverse uterine incision was made two cm above the uterovesical fold, and that incision extended transversely bluntly.The infant was  delivered from frank breech presentation was a female with Apgar scores of 9 at one minute and 9 at five minutes. After the umbilical cord was clamped and cut cord blood was obtained for evaluation. The placenta was removed intact and appeared normal. The uterine outline, tubes and ovaries appeared norma for the gravid state. The uterine incision was  closed with running locked sutures of 0 Vicryl. An imbricating layer of sutures was placed. Hemostasis was observed. Lavage was carried out until clear. The peritoneum was closed with a running suture of 2-0 Vicryl.  The rectus muscles were reapproximated with a figure of 8 suture of 2-0 Vicryl.  The fascia was then reapproximated with a running sutures of 0 Vicryl .Renforcing figure of 8 sutures of 0Vicryl were placed on either side of midline.   The skin was reapproximated with 3-0 moncryl in a subcuticular stitch A sterile dressing was applied.  Instrument, sponge, and needle counts were correct prior to the abdominal closure and at the conclusion of the case.   Findings:  Placenta contained a 3vessel cord and a succenturiate lobe    Estimated Blood Loss:  750 cc         Drains: none         Total IV Fluids:         Specimens: placenta         Implants: none         Complications: ::"None; patient tolerated the procedure well."         Disposition: PACU - hemodynamically stable.         Condition: stable  Attending Attestation: I performed the procedure.  Erinn Mendosa P  03/11/2012 7:07 AM

## 2012-03-11 NOTE — Transfer of Care (Signed)
Immediate Anesthesia Transfer of Care Note  Patient: Mindy [name]  Procedure(s) Performed: Procedure(s) (LRB): CESAREAN SECTION (N/A)  Patient Location: PACU  Anesthesia Type: Spinal  Level of Consciousness: awake, alert  and oriented  Airway & Oxygen Therapy: Patient Spontanous Breathing  Post-op Assessment: Report given to PACU RN and Post -op Vital signs reviewed and stable  Post vital signs: stable  Complications: No apparent anesthesia complications

## 2012-03-12 DIAGNOSIS — Z98891 History of uterine scar from previous surgery: Secondary | ICD-10-CM

## 2012-03-12 LAB — CBC
Hemoglobin: 11.1 g/dL — ABNORMAL LOW (ref 12.0–15.0)
MCHC: 33.1 g/dL (ref 30.0–36.0)
Platelets: 250 10*3/uL (ref 150–400)
RDW: 14.3 % (ref 11.5–15.5)

## 2012-03-12 NOTE — Progress Notes (Signed)
Subjective: Postpartum Day 1: Cesarean Delivery due to breech, SROM.  May desire d/c tomorrow. Patient reports no problems voiding.  Up ad lib, with no syncope or dizziness. Feeding:  Breast Contraceptive:  Undecided  Objective: Vital signs in last 24 hours: Temp:  [97.5 F (36.4 C)-98.1 F (36.7 C)] 97.5 F (36.4 C) (08/03 1422) Pulse Rate:  [63-73] 73  (08/03 1422) Resp:  [18] 18  (08/03 1422) BP: (116-127)/(65-78) 123/67 mmHg (08/03 1422) SpO2:  [98 %-99 %] 98 % (08/02 2300)  Physical Exam:  General: alert Lochia: appropriate Uterine Fundus: firm Incision: healing well DVT Evaluation: No evidence of DVT seen on physical exam. JP drain:  NA   Basename 03/12/12 0550 03/11/12 0510  HGB 11.1* 12.8  HCT 33.5* 38.6    Assessment/Plan: Status post Cesarean section. Doing well postoperatively.  Continue current care. Will consider d/c tomorrow.  Nigel Bridgeman 03/12/2012, 3:23 PM

## 2012-03-12 NOTE — Progress Notes (Signed)
Instructed to not sleep with infant in bed due to safety issues. Mother instructed this staff nurse that she is  Practicing "co-sleeping". So she would be sleeping with infant in bed.

## 2012-03-13 MED ORDER — OXYCODONE-ACETAMINOPHEN 5-325 MG PO TABS: 1.0000 | ORAL_TABLET | ORAL | Status: AC | PRN

## 2012-03-13 MED ORDER — IBUPROFEN 600 MG PO TABS: 600.0000 mg | ORAL_TABLET | Freq: Four times a day (QID) | ORAL | Status: AC | PRN

## 2012-03-13 NOTE — Discharge Summary (Addendum)
Obstetric Discharge Summary Reason for Admission: rupture of membranes and breech presentation Prenatal Procedures: NST and ultrasound Intrapartum Procedures: cesarean: low cervical, transverse Postpartum Procedures: none Complications-Operative and Postpartum: none Hemoglobin  Date Value Range Status  03/12/2012 11.1* 12.0 - 15.0 g/dL Final     HCT  Date Value Range Status  03/12/2012 33.5* 36.0 - 46.0 % Final   Patient was admitted on 03/11/12 with SROM and known breech presentation.  The breech position had been noted on [place] earlier in the week, and the patient was scheduled for an external version on 03/11/12 at 8am.  The patient experienced SROM early that am, with clear fluid and contractions noted.  She presented to MAU, with breech presentation confirmed.  She was taken to the operating room, where Dr. Pennie Rushing performed a primary LTCS under spinal anesthesia, with delivery of a viable female, with weight and Apgars as listed below. Infant was in good condition and remained at the patient's bedside.  The patient was taken to recovery in good condition.  Patient planned to breast feed.  On post-op day 1, patient was doing well, tolerating a regular diet, with Hgb of 11.1.  Throughout her stay, her physical exam was WNL, her incision was CDI, and her vital signs remained stable.  By post-op day 2, she was up ad lib, tolerating a regular diet, with good pain control with po med.  She desired early d/c.  She was deemed to have received the full benefit of her hospital stay, and was discharged home in stable condition.  Contraceptive choice was condoms, since patient hopes for another pregnancy within 2 years.      Physical Exam:  General: alert Lochia: appropriate Uterine Fundus: firm Incision: healing well DVT Evaluation: No evidence of DVT seen on physical exam. Negative Homan's sign.  Discharge Diagnoses: Term Pregnancy-delivered and breech presentation, SROM, primary LTCS  Discharge  Information: Date: 03/13/2012 Activity: Per [facility] handout Diet: routine Medications: Ibuprofen and Percocet Condition: stable Instructions: refer to practice specific booklet Discharge to: home Contraception: Condoms Follow-up Information    Follow up with [facility] in 6 weeks. (Call to schedule appointment)          Newborn Data: Live born female  Birth Weight: 8 lb 14.2 oz (4030 g) APGAR: 9, 9  Home with mother.  Mindy [name] 03/13/2012, 8:40 AM

## 2012-03-13 NOTE — Progress Notes (Signed)
Clinical Social Work Department PSYCHOSOCIAL ASSESSMENT - MATERNAL/CHILD 03/13/2012  Patient:  [name],[name]  Account Number:  [id number]  Admit Date:  03/11/2012  Childs Name:   Journey Doss    Clinical Social Worker:  Desyre Calma, LCSW   Date/Time:  03/13/2012 11:30 AM  Date Referred:  03/13/2012   Referral source  RN     Referred reason  Depression/Anxiety   Other referral source:    I:  FAMILY / HOME ENVIRONMENT Child's legal guardian:  PARENT  Guardian - Name Guardian - Age Guardian - Address  Mindy [name] 26 [address]  [place], [place]  Mindy [name]  [address]  [place], [place]   Other household support members/support persons Name Relationship DOB  N/A     Other support:   MGM    II  PSYCHOSOCIAL DATA Information Source:  Patient Interview  Financial and Community Resources Employment:   Financial resources:  Private Insurance If Medicaid - County:    School / Grade:   Maternity Care Coordinator / Child Services Coordination / Early Interventions:  Cultural issues impacting care:    III  STRENGTHS Strengths  Adequate Resources  Compliance with medical plan  Supportive family/friends  Home prepared for Child (including basic supplies)   Strength comment:    IV  RISK FACTORS AND CURRENT PROBLEMS Current Problem:  None   Risk Factor & Current Problem Patient Issue Family Issue Risk Factor / Current Problem Comment   N N     V  SOCIAL WORK ASSESSMENT CSW spoke with MOB and FOB at bedside.  Discussed mental health hx and current concerns.  MOB reports she was diagnosed 2-3 years ago and has been on prozac before the pregnancy and has done well with her symptoms and may wait to restart prozac.  MOB expressed she feels comfortable going to MD if needed to restart medication.  Discussed any concerns with supplie or support.  MOB and FOB do not voice any concerns at this time.  Please reconsult CSW if further needs arise.       VI SOCIAL WORK PLAN Social Work Plan  No Further Intervention Required / No Barriers to Discharge   Type of pt/family education:   If child protective services report - county:   If child protective services report - date:   Information/referral to community resources comment:   Other social work plan:    

## 2012-03-14 ENCOUNTER — Encounter (HOSPITAL_COMMUNITY): Payer: Self-pay | Admitting: Obstetrics and Gynecology

## 2012-04-21 ENCOUNTER — Ambulatory Visit (INDEPENDENT_AMBULATORY_CARE_PROVIDER_SITE_OTHER): Payer: BC Managed Care – PPO | Admitting: Obstetrics and Gynecology

## 2012-04-21 ENCOUNTER — Encounter: Payer: Self-pay | Admitting: Obstetrics and Gynecology

## 2012-04-21 NOTE — Progress Notes (Signed)
Pricilla A AMUNDSON-MOSS  is 6 weeks postpartum following a primary cesarean section, low transverse incision at 42 gestational weeks Date: 03/11/2012  female baby named Journey delivered by [employer].  Breastfeeding: yes Bottlefeeding:  no  Post-partum blues / depression:  no  EPDS score:  3 History of abnormal Pap:  no  Last Pap: Date  08/31/2011 Gestational diabetes:  no  Contraception:  Desires no method  Normal urinary function:  yes Normal GI function:  yes Returning to work:  no

## 2012-04-21 NOTE — Progress Notes (Signed)
Mindy [name] is a 26 y.o. female who presents for a postpartum visit. She had LTCS for persistent breech presentation.  Patient reports doing well.  Hx remarkable for: Patient Active Problem List  Diagnosis  . Depression  . S/P cesarean section      PPDS = 3--declines pp depression.  Contraception plan:  Condoms--declines hormonal methods   I have fully reviewed the prenatal and intrapartum course   Patient has not been sexually active since delivery.   The following portions of the patient's history were reviewed and updated as appropriate: allergies, current medications, past family history, past medical history, past social history, past surgical history and problem list.  Review of Systems Pertinent items are noted in HPI.   Objective:    BP 118/62  Ht 5' 3.75" (1.619 m)  Wt 174 lb (78.926 kg)  BMI 30.10 kg/m2  Breastfeeding? Yes  General:  alert, cooperative and no distress     Lungs: clear to auscultation bilaterally  Heart:  regular rate and rhythm, S1, S2 normal, no murmur  Abdomen: soft, non-tender; bowel sounds normal; no masses,  no organomegaly   Vulva:  normal  Vagina: normal vagina  Cervix:  normal  Uterus: normal size, contour, position, consistency, mobility, non-tender, well-involuted  Adnexa:  normal adnexa       Well-healed LTCS incision      Assessment:     Normal postpartum exam.  Pap smear not done at today's visit.  Due1/2014  Plan:  Follow-up at annual. Will use condoms for birth control.  Nigel Bridgeman CNM, [place] 04/21/2012 7:05 PM

## 2012-07-19 ENCOUNTER — Telehealth: Payer: Self-pay | Admitting: Obstetrics and Gynecology

## 2012-07-20 ENCOUNTER — Encounter: Payer: Self-pay | Admitting: Obstetrics and Gynecology

## 2012-07-20 ENCOUNTER — Ambulatory Visit (INDEPENDENT_AMBULATORY_CARE_PROVIDER_SITE_OTHER): Payer: BC Managed Care – PPO | Admitting: Obstetrics and Gynecology

## 2012-07-20 VITALS — BP 108/68 | Temp 98.5°F | Wt 165.0 lb

## 2012-07-20 DIAGNOSIS — N649 Disorder of breast, unspecified: Secondary | ICD-10-CM

## 2012-07-20 DIAGNOSIS — N6489 Other specified disorders of breast: Secondary | ICD-10-CM

## 2012-07-20 NOTE — Progress Notes (Signed)
Here for evaluation of recurrent "blister" on right nipple. Has been present on and off for several weeks--area gets swollen, painful, then resolves. Patient has been treated several times by pediatrician and his LC (Dr. Roxy Cedar and Anette Guarneri) for breast yeast and mastitis, always in right breast. Patient has drastically changed diet to eliminate gluten, sweets, dairy, etc--with some benefit on sx, but no complete resolution. No current sx of mastitis, no pain overall in breast. Baby feeding well, with good weight gain.  Patient doesn't feel latch is an issue.  PE: Left breast and nipple WNL Right breast WNL--no evidence mastitis Right nipple has area at 9 o'clock that looks like an area of chronic nipple trauma--not cracked, but rough and red.  Non-tender at present.  May be actually a poor/uneven latch issue, vs an issue of yeast....  Plan: Will Rx Dr. Allene Pyo Nipple Cream for use. Recommended patient consult with Lovenia Kim at [facility] for another opinion (patient saw her for Hypobirthing Class--patient had not remembered she was also LC). Will see if patient's pharmacy can Rx Dr. Allene Pyo Nipple cream--if not, will call in to [facility], where they will compound this mixture. To f/u prn.

## 2012-07-21 ENCOUNTER — Telehealth: Payer: Self-pay | Admitting: Obstetrics and Gynecology

## 2012-07-21 ENCOUNTER — Other Ambulatory Visit: Payer: Self-pay | Admitting: Obstetrics and Gynecology

## 2012-07-21 DIAGNOSIS — N649 Disorder of breast, unspecified: Secondary | ICD-10-CM | POA: Insufficient documentation

## 2012-07-21 NOTE — Telephone Encounter (Signed)
Message copied by Mason Jim on Thu Jul 21, 2012  9:58 AM ------      Message from: Cornelius Moras      Created: Wed Jul 20, 2012  6:45 PM      Regarding: Dr. Allene Pyo Nipple Cream        Please call patient's listed pharmacy and see if they have Dr. Allene Pyo Nipple Cream available--if so, Rx to apply TID, 1 tube, 1 refill and notify patient.            If not, call same Rx to [facility] and notify patient.            Thanks!      VL

## 2012-07-21 NOTE — Telephone Encounter (Signed)
Triage--please check on this--I sent a message yesterday pm regarding this request. Thanks! VL

## 2012-07-21 NOTE — Telephone Encounter (Signed)
VL to address

## 2012-07-21 NOTE — Telephone Encounter (Addendum)
RX called to [place] at [facility]. VM left for pt. That was called to [facility],

## 2012-08-04 ENCOUNTER — Other Ambulatory Visit: Payer: Self-pay | Admitting: Obstetrics and Gynecology

## 2012-08-04 ENCOUNTER — Telehealth: Payer: Self-pay | Admitting: Obstetrics and Gynecology

## 2012-08-04 DIAGNOSIS — N644 Mastodynia: Secondary | ICD-10-CM

## 2012-08-04 NOTE — Telephone Encounter (Signed)
TC from pt. States has been treated for thrush, mastitis and clogged milk ducts for several months while breast feeding. Has been seeing Jolaine Artist, breast feeding consultant who recommends eval for recurring clogged ducts. Per VL, refer to [facility].  Spoke with Noella at [facility] who advises breast U/S and F/U as needed. Order placed. [facility] to contact pt with appt. Pt notified.

## 2012-08-05 ENCOUNTER — Ambulatory Visit
Admission: RE | Admit: 2012-08-05 | Discharge: 2012-08-05 | Disposition: A | Discharge status: Home / Self Care | Payer: BC Managed Care – PPO | Source: Ambulatory Visit | Attending: Obstetrics and Gynecology | Admitting: Obstetrics and Gynecology

## 2012-08-05 ENCOUNTER — Telehealth: Payer: Self-pay | Admitting: Obstetrics and Gynecology

## 2012-08-05 DIAGNOSIS — N644 Mastodynia: Secondary | ICD-10-CM

## 2012-08-05 NOTE — Telephone Encounter (Signed)
Message copied by Mason Jim on Fri Aug 05, 2012  1:28 PM ------      Message from: Cornelius Moras      Created: Fri Aug 05, 2012 10:52 AM       Please call this result to Lovenia Kim at [facility] and send her a copy  for her records.      Thanks!      VL

## 2012-08-12 ENCOUNTER — Encounter: Payer: Self-pay | Admitting: Obstetrics and Gynecology

## 2012-08-12 NOTE — Progress Notes (Signed)
Late entry. TC to pt 08/05/12 at 1:28.  Informed of results of breast U/S.  OK to send report to Dyane Dustman. TC to [place] . Informed of results and copy mailed.

## 2013-06-05 IMAGING — US US BREAST*R*
1 series · 2 of 2 positions shown · non-contrast
Comparison: None.

CLINICAL DATA: The patient is breast-feeding a 5-month-old.  She
has had continuous problems with redness, firmness, and infections
in the right breast since [REDACTED].  She has had four rounds of anti
fungal treatment and sees a lactation consultant.

RIGHT BREAST ULTRASOUND

[Series 1: us breast*right* · 2 of 2 slices shown]
[im 1/2]
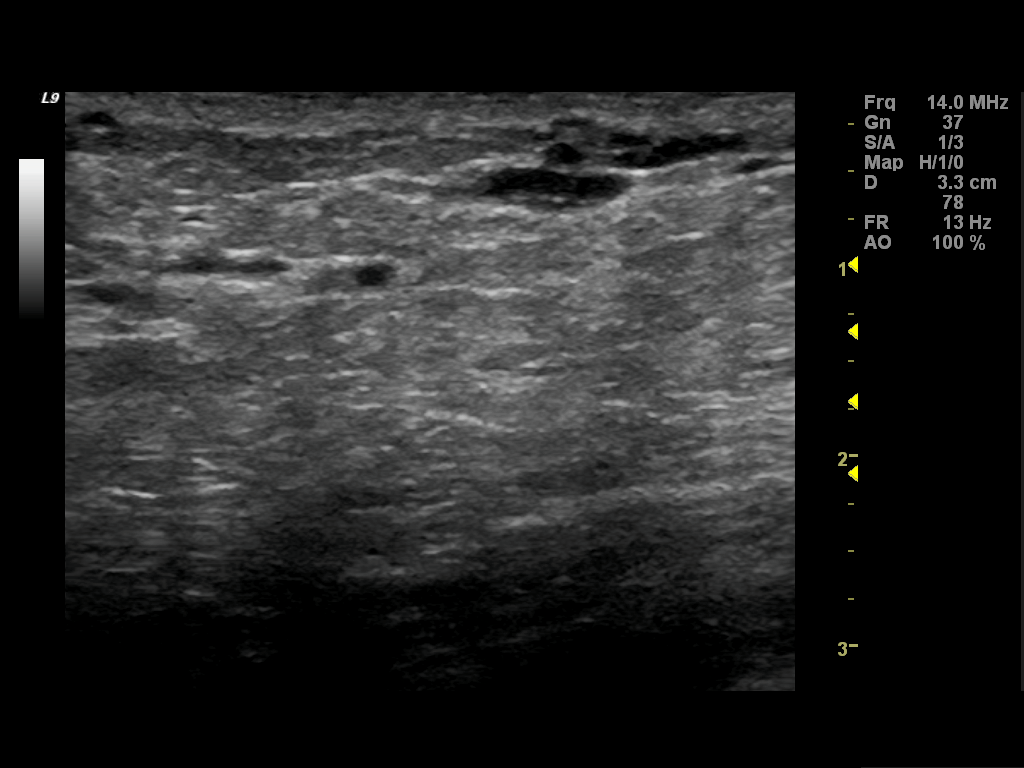
[im 2/2]
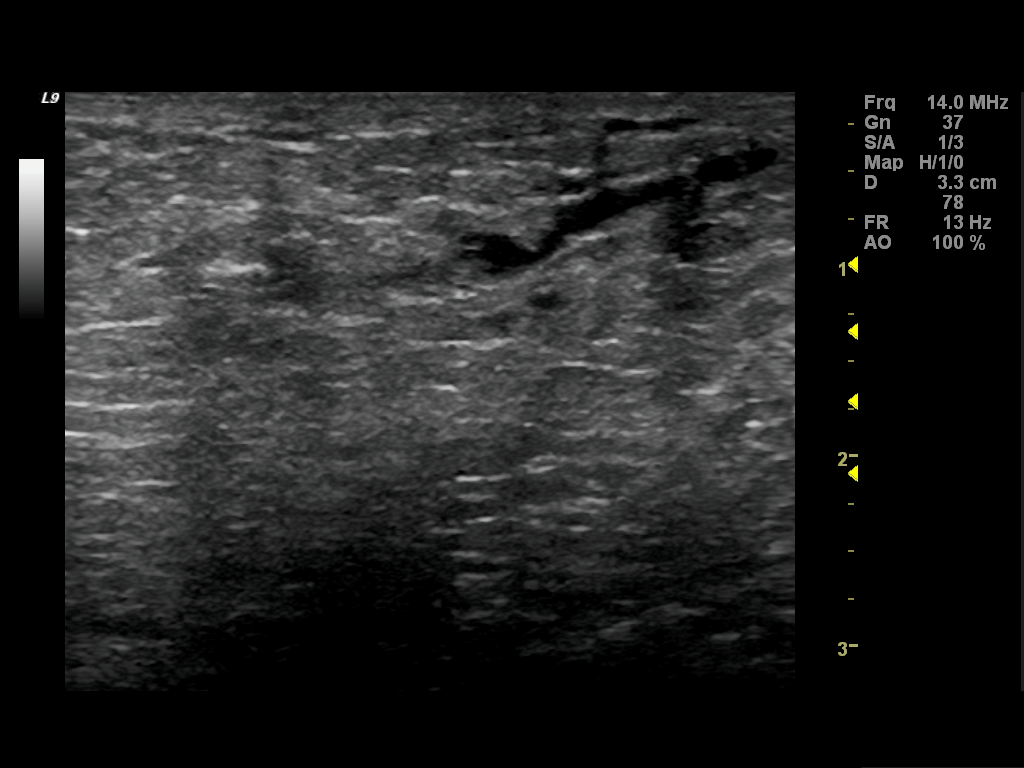

[2 of 2 positions shown; findings below may reference images not displayed]

On physical exam, the right breast is soft after breast-feeding.  I
palpate no discrete mass.  There is no erythema.  Although the
patient reports nipple tenderness, there is no scaling or erythema
of the nipple.  The patient reports that at times, portions of the
breast are firm and "ropey" in appearance.
FINDINGS: Ultrasound is performed, showing normal-appearing
fibroglandular tissue throughout the right breast.  There are
scattered visible ducts, none of which appear dilated.  No mass or
distortion identified.
IMPRESSION: No ultrasound evidence for malignancy.  There is no ultrasound
evidence for infection.

RECOMMENDATION:
Clinical follow-up is suggested. Annual screening mammography is
recommended to begin at age 40.

I have discussed the findings and recommendations with the patient.
Results were also provided in writing at the conclusion of the
visit.

BI-RADS CATEGORY 1:  Negative.

## 2014-06-11 ENCOUNTER — Encounter: Payer: Self-pay | Admitting: Obstetrics and Gynecology

## 2014-08-10 NOTE — L&D Delivery Note (Signed)
Delivery Note At 3:29 AM a viable female "Mindy [name]" was delivered via VBAC, Spontaneous (Presentation: OA restituting to Right Occiput Anterior).  APGARS: 8, 10; weight pending.   Placenta status: Intact, Spontaneous.  Cord: 3 vessels with the following complications: CAN x 1, manually reduced. Cord pH: NA  Anesthesia: Epidural  Episiotomy: None Lacerations: Deep right inner labial Suture Repair: 3.0 vicryl CT Est. Blood Loss (mL): 425  1000 mcg Cytotec placed per rectum due to mod-severe amount of bleeding s/p delivery of placenta. Moderate amount of clots manually expressed/removed from uterus after repair. Placed on prn Methergine series.   Mom to postpartum.  Baby to Couplet care / Skin to Skin.  Mom plans to breastfeed.  Birth control: Vasectomy.  Couple plans to take home placenta.  Sherre Scarlet 03/24/2015, 4:24 AM

## 2014-08-13 LAB — OB RESULTS CONSOLE GC/CHLAMYDIA
Chlamydia: NEGATIVE
GC PROBE AMP, GENITAL: NEGATIVE

## 2014-08-13 LAB — OB RESULTS CONSOLE RUBELLA ANTIBODY, IGM: Rubella: IMMUNE

## 2014-08-13 LAB — OB RESULTS CONSOLE HEPATITIS B SURFACE ANTIGEN: Hepatitis B Surface Ag: NEGATIVE

## 2014-08-13 LAB — OB RESULTS CONSOLE HIV ANTIBODY (ROUTINE TESTING): HIV: NONREACTIVE

## 2014-08-13 LAB — OB RESULTS CONSOLE RPR: RPR: NONREACTIVE

## 2015-03-01 LAB — OB RESULTS CONSOLE GBS: STREP GROUP B AG: POSITIVE

## 2015-03-21 ENCOUNTER — Encounter (HOSPITAL_COMMUNITY): Payer: Self-pay

## 2015-03-21 ENCOUNTER — Inpatient Hospital Stay (HOSPITAL_COMMUNITY)
Admission: AD | Admit: 2015-03-21 | Discharge: 2015-03-25 | DRG: 775 | Disposition: A | Discharge status: Home / Self Care | Payer: BLUE CROSS/BLUE SHIELD | Source: Ambulatory Visit | Attending: Obstetrics & Gynecology | Admitting: Obstetrics & Gynecology

## 2015-03-21 DIAGNOSIS — O4212 Full-term premature rupture of membranes, onset of labor more than 24 hours following rupture: Secondary | ICD-10-CM | POA: Diagnosis present

## 2015-03-21 DIAGNOSIS — Z87891 Personal history of nicotine dependence: Secondary | ICD-10-CM | POA: Diagnosis not present

## 2015-03-21 DIAGNOSIS — O99344 Other mental disorders complicating childbirth: Secondary | ICD-10-CM | POA: Diagnosis present

## 2015-03-21 DIAGNOSIS — O99824 Streptococcus B carrier state complicating childbirth: Secondary | ICD-10-CM | POA: Diagnosis present

## 2015-03-21 DIAGNOSIS — O34219 Maternal care for unspecified type scar from previous cesarean delivery: Secondary | ICD-10-CM | POA: Diagnosis not present

## 2015-03-21 DIAGNOSIS — F329 Major depressive disorder, single episode, unspecified: Secondary | ICD-10-CM | POA: Diagnosis present

## 2015-03-21 DIAGNOSIS — O3421 Maternal care for scar from previous cesarean delivery: Secondary | ICD-10-CM | POA: Diagnosis present

## 2015-03-21 DIAGNOSIS — Z3A40 40 weeks gestation of pregnancy: Secondary | ICD-10-CM | POA: Diagnosis present

## 2015-03-21 DIAGNOSIS — O321XX0 Maternal care for breech presentation, not applicable or unspecified: Secondary | ICD-10-CM | POA: Diagnosis present

## 2015-03-21 DIAGNOSIS — O480 Post-term pregnancy: Secondary | ICD-10-CM | POA: Diagnosis present

## 2015-03-21 LAB — CBC
HEMATOCRIT: 35.7 % — AB (ref 36.0–46.0)
Hemoglobin: 12 g/dL (ref 12.0–15.0)
MCH: 29.1 pg (ref 26.0–34.0)
MCHC: 33.6 g/dL (ref 30.0–36.0)
MCV: 86.7 fL (ref 78.0–100.0)
Platelets: 281 10*3/uL (ref 150–400)
RBC: 4.12 MIL/uL (ref 3.87–5.11)
RDW: 13.7 % (ref 11.5–15.5)
WBC: 11.1 10*3/uL — ABNORMAL HIGH (ref 4.0–10.5)

## 2015-03-21 LAB — TYPE AND SCREEN
ABO/RH(D): O POS
ANTIBODY SCREEN: NEGATIVE

## 2015-03-21 LAB — ABO/RH: ABO/RH(D): O POS

## 2015-03-21 MED ORDER — SODIUM CHLORIDE 0.9 % IV SOLN: 250.0000 mL | INTRAVENOUS | Status: DC | PRN

## 2015-03-21 MED ORDER — NALBUPHINE HCL 10 MG/ML IJ SOLN: 5.0000 mg | INTRAMUSCULAR | Status: DC | PRN

## 2015-03-21 MED ORDER — ACETAMINOPHEN 325 MG PO TABS
650.0000 mg | ORAL_TABLET | ORAL | Status: DC | PRN
Start: 2015-03-21 — End: 2015-03-24

## 2015-03-21 MED ORDER — AZITHROMYCIN 500 MG PO TABS
1000.0000 mg | ORAL_TABLET | Freq: Once | ORAL | Status: AC
  Administered 2015-03-21: 1000 mg via ORAL
  Filled 2015-03-21: qty 2

## 2015-03-21 MED ORDER — LACTATED RINGERS IV SOLN
500.0000 mL | INTRAVENOUS | Status: DC | PRN
  Administered 2015-03-23: 500 mL via INTRAVENOUS

## 2015-03-21 MED ORDER — SODIUM CHLORIDE 0.9 % IJ SOLN
3.0000 mL | Freq: Two times a day (BID) | INTRAMUSCULAR | Status: DC
  Administered 2015-03-22 (×2): 3 mL via INTRAVENOUS

## 2015-03-21 MED ORDER — OXYCODONE-ACETAMINOPHEN 5-325 MG PO TABS: 1.0000 | ORAL_TABLET | ORAL | Status: DC | PRN

## 2015-03-21 MED ORDER — PENICILLIN G POTASSIUM 5000000 UNITS IJ SOLR
5.0000 10*6.[IU] | Freq: Once | INTRAVENOUS | Status: AC
  Administered 2015-03-21: 5 10*6.[IU] via INTRAVENOUS
  Filled 2015-03-21: qty 5

## 2015-03-21 MED ORDER — LACTATED RINGERS IV SOLN
INTRAVENOUS | Status: DC
  Administered 2015-03-21 – 2015-03-23 (×3): via INTRAVENOUS

## 2015-03-21 MED ORDER — LIDOCAINE HCL (PF) 1 % IJ SOLN
30.0000 mL | INTRAMUSCULAR | Status: DC | PRN
  Filled 2015-03-21: qty 30

## 2015-03-21 MED ORDER — OXYTOCIN BOLUS FROM INFUSION: 500.0000 mL | INTRAVENOUS | Status: DC

## 2015-03-21 MED ORDER — PENICILLIN G POTASSIUM 5000000 UNITS IJ SOLR
2.5000 10*6.[IU] | INTRAVENOUS | Status: DC
  Administered 2015-03-21 – 2015-03-24 (×14): 2.5 10*6.[IU] via INTRAVENOUS
  Filled 2015-03-21 (×18): qty 2.5

## 2015-03-21 MED ORDER — ONDANSETRON HCL 4 MG/2ML IJ SOLN
4.0000 mg | Freq: Four times a day (QID) | INTRAMUSCULAR | Status: DC | PRN
  Administered 2015-03-23: 4 mg via INTRAVENOUS
  Filled 2015-03-21: qty 2

## 2015-03-21 MED ORDER — CITRIC ACID-SODIUM CITRATE 334-500 MG/5ML PO SOLN: 30.0000 mL | ORAL | Status: DC | PRN

## 2015-03-21 MED ORDER — SODIUM CHLORIDE 0.9 % IJ SOLN: 3.0000 mL | INTRAMUSCULAR | Status: DC | PRN

## 2015-03-21 MED ORDER — OXYTOCIN 40 UNITS IN LACTATED RINGERS INFUSION - SIMPLE MED: 62.5000 mL/h | INTRAVENOUS | Status: DC

## 2015-03-21 MED ORDER — OXYCODONE-ACETAMINOPHEN 5-325 MG PO TABS: 2.0000 | ORAL_TABLET | ORAL | Status: DC | PRN

## 2015-03-21 NOTE — H&P (Signed)
Mindy [name] is a 29 y.o. female, G2 P1 at 39.3 weeks  Came from the office with confirmed SROM  Patient Active Problem List   Diagnosis Date Noted  . Nipple lesion 07/20/12 07/21/2012  . S/P cesarean section 03/12/2012  . Depression 11/13/2011    Pregnancy Course: Patient entered care at 12.1 weeks.   EDC of 03/22/15 was established by LMP.      [place] evaluations:   18.6 weeks - Anatomy: EFW 10oz = 5%, FHR 145,    Cervix 3.14, breech and transverse, anterior placenta, no previa,  22.6 weeks - FU: EFW 1lb 4oz - 62%, FHR 150,  Breech oblique, anterior placenta normal fluid, female anatomy complete   Significant prenatal events:   SP CS for breech  Last evaluation:   39.6 weeks   VE:0/0/-2 today per Elane Fritz NP  Reason for admission:  SROM  Pt States:   Contractions Frequency: none         Contraction severity: n/a         Fetal activity: +FM  OB History    Gravida Para Term Preterm AB TAB SAB Ectopic Multiple Living   0 0 0 0 0 0 1     Past Medical History  Diagnosis Date  . Asthma   . History of chicken pox   . Yeast infection   . Bacterial infection   . Depression    Past Surgical History  Procedure Laterality Date  . Wisdom tooth extraction    . Cesarean section  03/11/2012    Procedure: CESAREAN SECTION;  Surgeon: Hal Morales, MD;  Location: [facility];  Service: Gynecology;  Laterality: N/A;   Family History: family history is not on file. Social History:  reports that she has quit smoking. Her smoking use included Cigarettes. She has a 3 pack-year smoking history. She has never used smokeless tobacco. She reports that she does not drink alcohol or use illicit drugs.   Prenatal Transfer Tool  Maternal Diabetes: No Genetic Screening: Normal Maternal Ultrasounds/Referrals: Normal Fetal Ultrasounds or other Referrals:  None Maternal Substance Abuse:  No Significant Maternal Medications:  Meds include: Other:  Livaplex, Lact-Enz,  ClearVit-PSF, Significant Maternal Lab Results: Lab values include: Group B Strep positive   ROS:  See HPI above, all other systems are negative  No Known Allergies    Blood pressure 118/60, pulse 84, temperature 98.6 F (37 C), temperature source Oral, resp. rate 16, height  (1.6 m), weight 199 lb (90.266 kg), currently breastfeeding.  Maternal Exam:  Uterine Assessment: Contraction frequency is rare.  Abdomen: Gravid, non tender. Fundal height is aga.  Normal external genitalia, vulva, cervix, uterus and adnexa.  No lesions noted on exam.  Pelvis adequate for delivery.  Fetal presentation: Vertex by Leopold's   Fetal Exam:  Monitor Surveillance : Continuous Monitoring  Mode: Ultrasound.  NICHD: Category 1 CTXs: Q 3-4 minutes mild EFW   7.5 lbs  Physical Exam: Nursing note and vitals reviewed General: alert and cooperative She appears well nourished Psychiatric: Normal mood and affect. Her behavior is normal Head: Normocephalic Eyes: Pupils are equal, round, and reactive to light Neck: Normal range of motion Cardiovascular: RRR without murmur  Respiratory: CTAB. Effort normal  Abd: soft, non-tender, +BS, no rebound, no guarding  Genitourinary: Vagina normal  Neurological: A&Ox3 Skin: Warm and dry  Musculoskeletal: Normal range of motion  Homan's sign negative bilaterally No evidence of DVTs.  Edema:Minimal bilaterally non-pitting edema DTR: 2+ Clonus:  None   Prenatal labs: ABO, Rh:  O positive Antibody:  negative Rubella:   immune RPR:   NR HBsAg:   negative HIV:   NR GBS:  positive Sickle cell/Hgb electrophoresis:  WNL Pap:   GC:   negative Chlamydia: negative Genetic screenings:   Glucola:    Assessment:  IUP at 39.6 weeks NICHD: Category  Membranes: SROM x 5hrs Bishop Score: 0 GBS positive  Plan:  Admit to L&D for expectant/active management of labor. Possible augmentation options reviewed including foley bulb, AROM and/or pitocin.   GBS prophylaxis with PCN G per Jereline Ticer dosing with onset of active labor.  IV pain medication per orders PRN Epidural per patient request Foley cath after patient is comfortable with epidural Anticipate SVD  Labor mgmt as ordered   Attending MD available at all times.  Cherilynn Schomburg, CNM, MSN 03/21/2015, 11:21 AM

## 2015-03-21 NOTE — MAU Note (Signed)
Pt presents to MAU from physician's office for ROM this morning at 6 am. PT is a G2 P1 with previous C/S with last pregnancy for breech presentation. Awaiting a bed in L&D

## 2015-03-21 NOTE — MAU Note (Signed)
Patient notice a rupture of membranes around 600. Went to her doctor and they confirmed that the membranes have been broken and sent her here.

## 2015-03-21 NOTE — MAU Note (Signed)
Notified Venus Standard CNM patient in MAU room #3 holding for admit to L&D no beds available, per CNM patient GBS positive so will need to be started on antibiotic, also patient prefers to be monitored intermittently so CNM will place order for this.

## 2015-03-21 NOTE — Progress Notes (Signed)
Mindy [name] is a 29 y.o. G2P1001 at [redacted]w[redacted]d admitted for rupture of membranes.  Subjective:  The patient reports that she is having mild contractions. She denies bleeding. She declines VTE prophylaxis.  Objective:  BP 118/60 mmHg  Pulse 84  Temp(Src) 98.6 F (37 C) (Oral)  Resp 16  Ht  (1.6 m)  Wt 199 lb (90.266 kg)  BMI 35.26 kg/m2     FHT:  Cat: 1 UC:   Mild SVE:     deferred  Labs:  Lab Results  Component Value Date   WBC 11.1* 03/21/2015   HGB 12.0 03/21/2015   HCT 35.7* 03/21/2015   MCV 86.7 03/21/2015   PLT 281 03/21/2015    Assessment / Plan:  Premature rupture membranes at term   Prior cesarean section, desires a trial of labor after cesarean section  Positive beta strep  No active labor  I discussed management options with the patient and her husband. The risk and benefits of those options were outlined. The patient and her husband state that they do not want intervention. They decline Foley bulb placement and augmentation of any kind. I specifically reviewed the risk of infection and the associated morbidity for the mother and the baby. Again they declined. The patient also declines pain medication. I reviewed her options. I asked that she at least consider what we can do to help, and that she let us know if she changes her mind or wants to discuss the issues further.  Janine Limbo 03/21/2015, 3:18 PM

## 2015-03-21 NOTE — MAU Note (Signed)
Urine in lab 

## 2015-03-21 NOTE — Progress Notes (Addendum)
Mindy [name] MRN: [id number]  Subjective: -Care Assumed of 29y.o. G2P1001 at 39.6wks who presents s/p PROM at 0630. Patient resting in bed.  Reports mild occasional contractions and good fetal movement.  Denies VB.  Declines VE and states she would like to labor without intervention.  Patient states she will utilize hypnobirthing for pain mgmt.  No questions or concerns at current.   Objective: BP 132/82 mmHg  Pulse 74  Temp(Src) 98.3 F (36.8 C) (Oral)  Resp 18  Ht  (1.6 m)  Wt 90.266 kg (199 lb)  BMI 35.26 kg/m2     FHT: 135 bpm, Mod Var, -Decels, +Accels UC: Q93min, irregular   SVE:     Deferred Membranes:AROM x 13hrs Pitocin:None  Assessment:  IUP at 39.6wks Cat I FT  TOLAC Expectant Mgmt  Plan: -Regular diet until MN -Intermittent monitoring -Continue other mgmt as ordered   Mindy Babe LYNN,MSN, CNM 03/21/2015, 7:55 PM    Addendum: Confirmed Vertex presentation by provider performed bedside US Dr. AVS recommends Azithromycin 1grm PO for PROM Patient informed of recommendation, questions and concerns addressed Patient accepts Order placed Will continue mgmt as ordered  Mindy Bast MSN, CNM 03/21/2015 9:45 PM

## 2015-03-22 LAB — RPR: RPR: NONREACTIVE

## 2015-03-22 NOTE — Progress Notes (Signed)
  Subjective: Comfortable--only occasional cramping.  Asked "what can I do naturally?" to stimulate UCs.  Objective: BP 115/72 mmHg  Pulse 83  Temp(Src) 98.2 F (36.8 C) (Oral)  Resp 18  Ht  (1.6 m)  Wt 90.266 kg (199 lb)  BMI 35.26 kg/m2      FHT: Category 1 on intermittent monitoring UC:   Very occasional, mild SVE:    Deferred   Assessment:  PROM at term x 31 1/2 hrs. No labor GBS positive--on PCN  Plan: Offered use of breast pump in an attempt to stimulate contractions.  Patient agreeable with plan.  Will apply unilaterally q 10 min per side, then rest x 30 min--may use as desired, d/c if UCs become regular and/or painful. Will continue to observe Patient declines any additional augmentation at this time.  Nigel Bridgeman CNM 03/22/2015, 1:21 PM

## 2015-03-22 NOTE — Progress Notes (Signed)
Mindy [name] MRN: [id number]  Subjective: -Patient resting in bed, reading.  Reports that "I am in the same condition as when you left me."  Patient reports contractions that "are stronger, but nothing to write home about."  Patient reports fetal movement and continued leaking of fluid.    Objective: BP 125/73 mmHg  Pulse 83  Temp(Src) 98.5 F (36.9 C) (Oral)  Resp 18  Ht  (1.6 m)  Wt 90.266 kg (199 lb)  BMI 35.26 kg/m2      1845 FHT: 145 bpm, Mod Var, -Decels, +Accels UC: Q55min SVE:    Deferred Membranes:AROM x 36hrs Pitocin:None  Assessment:  IUP at 40wks Cat I FT  Expectant Mgmt per Patient Desire Prolonged ROM GBS Positive  Plan: -Discussed POC to include patient starting pitocin at 6am.  Patient corrects provider and states she will start to consider pitocin and/or other interventions at 6am.   -Patient remains on regular diet -6th Dose of PCN given -Dr. Lance Morin consulted regarding repeating azithromycin or giving other antibiotics and advised that not necessary as patient is not PPROM and is currently receiving PCN -Will continue NSTs every 2 hrs -Continue other mgmt as ordered  [facility], Mindy Kruser LYNN,MSN, CNM 03/22/2015, 7:44 PM

## 2015-03-22 NOTE — Progress Notes (Signed)
Subjective: Aware of one contraction this am--slept well.  Still leaking.  Objective: BP 125/61 mmHg  Pulse 85  Temp(Src) 97.6 F (36.4 C) (Oral)  Resp 18  Ht  (1.6 m)  Wt 90.266 kg (199 lb)  BMI 35.26 kg/m2      FHT: Category 1 on intermittent monitoring UC:   Very occasional SVE:    Deferred--closed, thick on exam in office yesterday  Receiving 4th dose of PCN. Received single dose of Azithromycin at 2308.  Assessment:  IUP at 40 weeks SROM x almost 26 hours GBS positive Previous C/S for breech, desires VBAC  Plan: Long discussion with patient regarding issue of PROM at term, no labor, previous C/S, desire for VBAC. Patient very clear that she does not want any intervention/augmentation unless definite signs of maternal/fetal compromise (infection, hypertension, NRFHR, etc) are noted.  I clearly asked her if she is willing to accept the risks of chorioamnionitis (fetal/newborn compromise, endometritis, dysfunctional labor, etc).  She advised me she is willing to accept those and any other risks and will continue to decline induction/augmentation of labor. I also reviewed with her the status of her cervix on exam yesterday, and no evidence of labor--I advised her labor may occur as a result of infection, with the potential for maternal fetal compromise, if we wait until s/s of infection occur.  She again states she is willing to accept that risk. Per patient's plan, we will continue to observe maternal/fetal status closely. Still OK for intermittent monitoring. Continue ATB prophylaxis.  Will consult with Dr. Sallye Ober regarding plan of care.  Nigel Bridgeman CNM, [place] 03/22/2015, 7:49 AM

## 2015-03-22 NOTE — Progress Notes (Signed)
Mindy [name] [id number]  Subjective: Strip and Chart Reviewed.   Objective:  Filed Vitals:   03/21/15 1018 03/21/15 1739 03/21/15 1912 03/21/15 2103  BP: 118/60 124/71 132/82   Pulse: 84 76 74   Temp: 98.6 F (37 C) 98.4 F (36.9 C) 98.3 F (36.8 C) 98.1 F (36.7 C)  TempSrc: Oral Oral Oral Oral  Resp: Height:  (1.6 m)     Weight: 90.266 kg (199 lb)       FHR: 125 bpm, Mod Var, -Decels, +Accels UC: None Graphed, Questionable Irritability  Assessment: IUP at [redacted]w[redacted]d Cat I FT PROM x 18hrs TOLAC Expectant Mgmt  Plan: -Continue present mgmt -Will assess patient prior to shift change or prn  Sabas Sous, CNM 03/22/2015 12:21 AM

## 2015-03-22 NOTE — Progress Notes (Signed)
  Subjective: Ambulated, napped, now on NST.  Occasional UCs, +FM, occasional leaking of clear fluid.  Objective: BP 117/73 mmHg  Pulse 89  Temp(Src) 98.5 F (36.9 C) (Oral)  Resp 18  Ht  (1.6 m)  Wt 90.266 kg (199 lb)  BMI 35.26 kg/m2      FHT: Category 1 on NST q 2 hours UC:   Very sporadic SVE:    Deferred  Assessment:  PROM x 36 3/4 hours No labor GBS positive  Plan: Will continue to observe--re-evaluate when patient willing to accept augmentation or prn.  Nigel Bridgeman CNM 03/22/2015, 6:47 PM

## 2015-03-22 NOTE — Progress Notes (Addendum)
  Subjective: Tried breast pump with some contractions after use, but no persistence.  "Getting frustrated" but still declines any intervention.  Hopes she will contract more tonight (when she routinely has more UCs).  Objective: BP 115/72 mmHg  Pulse 83  Temp(Src) 98.2 F (36.8 C) (Oral)  Resp 18  Ht  (1.6 m)  Wt 90.266 kg (199 lb)  BMI 35.26 kg/m2      FHT: Category 1 on intermittent monitoring UC:   Occasional SVE:    Deferred    Assessment:  PROM x 32 1/2 hrs. No labor GBS positive  Plan: Dr. Sallye Ober in to see patient and review status and plan of care.  Long discussion with patient and husband regarding their perspective on plan of care. Patient advises she would likely be willing to consider/discuss possible intervention if no advancement in labor by 6am (48 hours). Reviewed limitations on methods available for induction due to ROM and previous C/S, with discussion of pitocin characteristics and utilization. Will continue to observe at present.  Nigel Bridgeman CNM 03/22/2015, 2:30p    I saw patient at bedside and agree with above findings, assessment and plan.  Patient and her husband desire continued expectant management with nipple stimulation only, understanding that the risks of infection and other comorbidities increase with passage of time.  She expressed concern about pitocin causing painful contractions, we discussed pain management in labor and she understood that there are several pain management options for her available as needed.  She expressed desire to wait about 48 hours from rupture of membranes before considering additional labor management options.  All questions were answered at bedside.  Dr. Sallye Ober.

## 2015-03-23 ENCOUNTER — Inpatient Hospital Stay (HOSPITAL_COMMUNITY): Payer: BLUE CROSS/BLUE SHIELD | Admitting: Anesthesiology

## 2015-03-23 MED ORDER — FENTANYL 2.5 MCG/ML BUPIVACAINE 1/10 % EPIDURAL INFUSION (WH - ANES)
14.0000 mL/h | INTRAMUSCULAR | Status: DC | PRN
  Administered 2015-03-23: 14 mL/h via EPIDURAL
  Administered 2015-03-23: 12 mL/h via EPIDURAL
  Administered 2015-03-24: 8 mL/h via EPIDURAL
  Filled 2015-03-23: qty 125

## 2015-03-23 MED ORDER — EPHEDRINE 5 MG/ML INJ: 10.0000 mg | INTRAVENOUS | Status: DC | PRN

## 2015-03-23 MED ORDER — DIPHENHYDRAMINE HCL 50 MG/ML IJ SOLN: 12.5000 mg | INTRAMUSCULAR | Status: DC | PRN

## 2015-03-23 MED ORDER — OXYTOCIN 40 UNITS IN LACTATED RINGERS INFUSION - SIMPLE MED
1.0000 m[IU]/min | INTRAVENOUS | Status: DC
  Administered 2015-03-23: 7 m[IU]/min via INTRAVENOUS

## 2015-03-23 MED ORDER — ZOLPIDEM TARTRATE 5 MG PO TABS: 5.0000 mg | ORAL_TABLET | Freq: Every evening | ORAL | Status: DC | PRN

## 2015-03-23 MED ORDER — FAMOTIDINE IN NACL 20-0.9 MG/50ML-% IV SOLN
20.0000 mg | Freq: Once | INTRAVENOUS | Status: AC
  Administered 2015-03-23: 20 mg via INTRAVENOUS
  Filled 2015-03-23: qty 50

## 2015-03-23 MED ORDER — OXYTOCIN 40 UNITS IN LACTATED RINGERS INFUSION - SIMPLE MED
1.0000 m[IU]/min | INTRAVENOUS | Status: DC
  Administered 2015-03-23: 1 m[IU]/min via INTRAVENOUS
  Filled 2015-03-23: qty 1000

## 2015-03-23 MED ORDER — TERBUTALINE SULFATE 1 MG/ML IJ SOLN: 0.2500 mg | Freq: Once | INTRAMUSCULAR | Status: DC | PRN

## 2015-03-23 MED ORDER — PHENYLEPHRINE 40 MCG/ML (10ML) SYRINGE FOR IV PUSH (FOR BLOOD PRESSURE SUPPORT)
80.0000 ug | PREFILLED_SYRINGE | INTRAVENOUS | Status: DC | PRN
  Filled 2015-03-23: qty 20

## 2015-03-23 NOTE — Progress Notes (Addendum)
  Subjective: Was asked by pt to be rechecked. In tears, stating she would like pain medication if cervix unchanged. Reports urge to push. Spouse remains at bedside and supportive.  Objective: BP 137/77 mmHg  Pulse 82  Temp(Src) 97.9 F (36.6 C) (Oral)  Resp 20  Ht  (1.6 m)  Wt 90.266 kg (199 lb)  BMI 35.26 kg/m2     Today's Vitals   03/23/15 1700 03/23/15 1730 03/23/15 1818 03/23/15 2113  BP:  132/88 137/77   Pulse:  76 82   Temp:  98 F (36.7 C) 97.9 F (36.6 C)   TempSrc:  Oral Oral   Resp: Height:      Weight:      PainSc:    8    FHT: BL 140 w/ moderate variability, +accels, no decels UC:   irregular, every 2-4 minutes SVE:   Dilation: 7 Effacement (%): 100 Station: 0 Exam by:: Caryl Never Pitocin at 5 mU/min  Assessment:  IUP at term Prolonged ROM, now 63 hrs Afebrile GBS positive, adequately treated Cvx unchanged Cat 1 FHRT  Plan: Epidural. Continue to increase Pitocin once comfortable w/ epidural. Dr. Sallye Ober updated.    Sherre Scarlet CNM 03/23/2015, 9:33 PM

## 2015-03-23 NOTE — Progress Notes (Signed)
  Subjective: Working with relaxation techniques--requested I examine her.  More tired, struggling at times.  Objective: BP 137/77 mmHg  Pulse 82  Temp(Src) 97.9 F (36.6 C) (Oral)  Resp 20  Ht  (1.6 m)  Wt 90.266 kg (199 lb)  BMI 35.26 kg/m2      FHT: Category 1 UC:   regular, every 2-4 minutes SVE:   Dilation: 7 Effacement (%): 100 Station: 0 Exam by:: Chip Boer CNM Pitocin at 5  Assessment:  PROM x 61 1/2 hours GBS positive Previous C/S, desires VBAC Progressive labor  Plan: Continue current care. Support and encouragement to patient for labor goals.   Nigel Bridgeman CNM 03/23/2015, 7:36 PM

## 2015-03-23 NOTE — Progress Notes (Addendum)
  Subjective: Comfortable w/ epidural -- sleeping. Spouse at bedside. Desires cervical exam prior to increasing Pitocin.  Objective: BP 101/59 mmHg  Pulse 90  Temp(Src) 97.9 F (36.6 C) (Oral)  Resp 18  Ht  (1.6 m)  Wt 90.266 kg (199 lb)  BMI 35.26 kg/m2  SpO2 100%    Today's Vitals   03/23/15 2230 03/23/15 2235 03/23/15 2300 03/23/15 2330  BP: 122/62 127/69 117/64 101/59  Pulse: 74 78 67 90  Temp:      TempSrc:      Resp:   18 18  Height:      Weight:      SpO2:      PainSc:       Total I/O In: -  Out: 150 [Urine:150]  FHT: Category 1 UC:   irregular SVE: AL/100/0 Pitocin at 5 mU/min  Assessment:  Prolonged ROM Afebrile Cat 1 FHRT Progressive labor  Plan: Increase Pitocin -- position change Anticipate progress and SVD  Sherre Scarlet CNM 03/23/2015, 11:37 PM

## 2015-03-23 NOTE — Progress Notes (Signed)
  Subjective: Doing well, aware of some UCs as more intense, but still irregular in quality and frequency.  Planning use of hypnobirthing techniques for pain management.  Objective: BP 130/78 mmHg  Pulse 73  Temp(Src) 97.8 F (36.6 C) (Oral)  Resp 16  Ht  (1.6 m)  Wt 90.266 kg (199 lb)  BMI 35.26 kg/m2       FHT: Category 1 UC:   irregular, every 2-5 minutes SVE:   Deferred Pitocin at 4 mu/min (allowing increase by 1 mu/min per hr)  Assessment:  PROM x 56 hours GBS positive On pitocin augmentation Previous C/S, desires VBAC  Plan: Continue current care.  Nigel Bridgeman CNM 03/23/2015, 2:05 PM

## 2015-03-23 NOTE — Progress Notes (Signed)
Subjective: Feeling more intense contractions--requests VE.  Husband at bedside, supportive.  Objective: BP 135/74 mmHg  Pulse 75  Temp(Src) 97.2 F (36.2 C) (Oral)  Resp 18  Ht  (1.6 m)  Wt 90.266 kg (199 lb)  BMI 35.26 kg/m2      FHT: Category 1 UC:   irregular, every 2-4 minutes SVE:   Dilation: 5 Effacement (%): 100 Station: -1 Exam by:: Ronya Gilcrest Pitocin at 4 mu/min  Assessment:  PROM x 58 hours GBS positive Previous C/S, desires VBAC  Plan: Continue current plan--per patient request, will hold pitocin at current level.  Nigel Bridgeman CNM, [place] 03/23/2015, 3:57 PM

## 2015-03-23 NOTE — Progress Notes (Signed)
Patient ready for VE.  Had light meal, showered.  FHR Category 1 Occasional UCs. Leaking clear fluid.  Cervix 2 cm, 80%, vtx, -1/0.  Will start pitocin augmentation as negotiated/ Initiate at 1 mu/min, increase by 1 mu/min in 30 min, then hold there x 1 hour.  If no change in labor status in 1 hour, will increase by 1 mu/min per hour until regular contractions achieved.  Patient and husband agreeable with plan.  They understand continuous monitoring is required with pitocin and VBAC status.  Nigel Bridgeman, CNM 03/23/15 [phone]

## 2015-03-23 NOTE — Progress Notes (Signed)
Subjective: Relaxing well with contractions, using hypnobirthing tapes for support.  Feels some contractions are tripling.  Objective: BP 132/88 mmHg  Pulse 76  Temp(Src) 97.2 F (36.2 C) (Oral)  Resp 20  Ht  (1.6 m)  Wt 90.266 kg (199 lb)  BMI 35.26 kg/m2      FHT: Category 1 UC:   irregular, every 2-5 minutes, some tripling per observation. SVE:   Dilation: 5 Effacement (%): 100 Station: -1 Exam by:: Ebone Alcivar at 4p Pitocin at 4 mu/min--have held there per patient request.  Assessment:  Early/active labor PROM x 60 hrs. GBS positive Previous C/S, desires VBAC  Plan: Reviewed labor status--patient agreeable with increasing pitocin to 5 mu/min. Continue labor support.  Nigel Bridgeman CNM, [place] 03/23/2015, 6:12 PM

## 2015-03-23 NOTE — Progress Notes (Signed)
  Subjective: Slept at 2-3 hour intervals during night.  Did "lots of relaxation and affirmations" to assist with decision-making.  Husband at bedside.  Requests shower and light meal, then agreeable to start pitocin, but wants pitocin increased very slowly to allow for her to use hypnobirthing techniques.  Reports UCs still irregular, but stronger when they occur.  Objective: BP 122/74 mmHg  Pulse 91  Temp(Src) 98.7 F (37.1 C) (Oral)  Resp 18  Ht  (1.6 m)  Wt 90.266 kg (199 lb)  BMI 35.26 kg/m2      FHT: Category 1 on intermittent monitoring UC:   Irregular, q 6-9 min apart-- SVE:    Deferred  Assessment:  PROM x 49 1/2 hrs. No labor GBS positive Previous C/S for breech, desires VBAC  Plan: Reviewed recommendation for pitocin augmentation due to prolonged ROM without labor, GBS + status, and hx of prior C/S. Long discussion regarding use of pitocin--patient agreeable to starting at 1 mu/min, increasing by 1 in 30 min, then holding at that level for 1 hour to observe for status of UCs. Will check cervix after patient showers/has light meal, then start pitocin as negotiated.  Nigel Bridgeman CNM 03/23/2015, 9:02 AM

## 2015-03-23 NOTE — Progress Notes (Signed)
Mindy [name] [id number]  Subjective: Strip and Chart Reviewed.  Objective:  Filed Vitals:   03/22/15 1544 03/22/15 1815 03/22/15 1940 03/22/15 2330  BP: 117/73 124/63 125/73   Pulse: 89 79 83   Temp: 98.5 F (36.9 C)  98.5 F (36.9 C) 98.3 F (36.8 C)  TempSrc: Oral  Oral Oral  Resp: 18  18   Height:      Weight:       2328-0048 FHR: 125 bpm, Mod Var, -Decels, +Accels UC: Irregular  Assessment: IUP at [redacted]w[redacted]d Cat I FT Prolonged ROM GBS Positive Expectant Mgmt per Pt Desire  Plan: -Continue present mgmt as ordered  Sabas Sous, CNM 03/23/2015 12:56 AM

## 2015-03-24 ENCOUNTER — Encounter (HOSPITAL_COMMUNITY): Payer: Self-pay | Admitting: General Practice

## 2015-03-24 DIAGNOSIS — O34219 Maternal care for unspecified type scar from previous cesarean delivery: Secondary | ICD-10-CM | POA: Diagnosis not present

## 2015-03-24 LAB — CBC
HCT: 30.9 % — ABNORMAL LOW (ref 36.0–46.0)
Hemoglobin: 10.6 g/dL — ABNORMAL LOW (ref 12.0–15.0)
MCH: 29.7 pg (ref 26.0–34.0)
MCHC: 34.3 g/dL (ref 30.0–36.0)
MCV: 86.6 fL (ref 78.0–100.0)
PLATELETS: 243 10*3/uL (ref 150–400)
RBC: 3.57 MIL/uL — ABNORMAL LOW (ref 3.87–5.11)
RDW: 13.8 % (ref 11.5–15.5)
WBC: 17.4 10*3/uL — AB (ref 4.0–10.5)

## 2015-03-24 MED ORDER — IBUPROFEN 600 MG PO TABS
600.0000 mg | ORAL_TABLET | Freq: Four times a day (QID) | ORAL | Status: DC
  Administered 2015-03-24 – 2015-03-25 (×4): 600 mg via ORAL
  Filled 2015-03-24 (×5): qty 1

## 2015-03-24 MED ORDER — BUPIVACAINE HCL (PF) 0.25 % IJ SOLN
INTRAMUSCULAR | Status: DC | PRN
  Administered 2015-03-23 (×2): 4 mL

## 2015-03-24 MED ORDER — SIMETHICONE 80 MG PO CHEW: 80.0000 mg | CHEWABLE_TABLET | ORAL | Status: DC | PRN

## 2015-03-24 MED ORDER — LANOLIN HYDROUS EX OINT: TOPICAL_OINTMENT | CUTANEOUS | Status: DC | PRN

## 2015-03-24 MED ORDER — WITCH HAZEL-GLYCERIN EX PADS: 1.0000 "application " | MEDICATED_PAD | CUTANEOUS | Status: DC | PRN

## 2015-03-24 MED ORDER — PRENATAL MULTIVITAMIN CH
1.0000 | ORAL_TABLET | Freq: Every day | ORAL | Status: DC
  Administered 2015-03-24: 1 via ORAL
  Filled 2015-03-24: qty 1

## 2015-03-24 MED ORDER — ONDANSETRON HCL 4 MG PO TABS: 4.0000 mg | ORAL_TABLET | ORAL | Status: DC | PRN

## 2015-03-24 MED ORDER — LIDOCAINE-EPINEPHRINE (PF) 2 %-1:200000 IJ SOLN
INTRAMUSCULAR | Status: DC | PRN
  Administered 2015-03-23: 4 mL

## 2015-03-24 MED ORDER — DIBUCAINE 1 % RE OINT
1.0000 "application " | TOPICAL_OINTMENT | RECTAL | Status: DC | PRN
  Filled 2015-03-24: qty 28

## 2015-03-24 MED ORDER — TETANUS-DIPHTH-ACELL PERTUSSIS 5-2.5-18.5 LF-MCG/0.5 IM SUSP: 0.5000 mL | Freq: Once | INTRAMUSCULAR | Status: DC

## 2015-03-24 MED ORDER — ACETAMINOPHEN 325 MG PO TABS: 650.0000 mg | ORAL_TABLET | ORAL | Status: DC | PRN

## 2015-03-24 MED ORDER — ZOLPIDEM TARTRATE 5 MG PO TABS: 5.0000 mg | ORAL_TABLET | Freq: Every evening | ORAL | Status: DC | PRN

## 2015-03-24 MED ORDER — ONDANSETRON HCL 4 MG/2ML IJ SOLN: 4.0000 mg | INTRAMUSCULAR | Status: DC | PRN

## 2015-03-24 MED ORDER — DIPHENHYDRAMINE HCL 25 MG PO CAPS: 25.0000 mg | ORAL_CAPSULE | Freq: Four times a day (QID) | ORAL | Status: DC | PRN

## 2015-03-24 MED ORDER — METHYLERGONOVINE MALEATE 0.2 MG PO TABS: 0.2000 mg | ORAL_TABLET | ORAL | Status: DC | PRN

## 2015-03-24 MED ORDER — SENNOSIDES-DOCUSATE SODIUM 8.6-50 MG PO TABS
2.0000 | ORAL_TABLET | ORAL | Status: DC
  Administered 2015-03-24: 2 via ORAL
  Filled 2015-03-24: qty 2

## 2015-03-24 MED ORDER — BENZOCAINE-MENTHOL 20-0.5 % EX AERO
1.0000 "application " | INHALATION_SPRAY | CUTANEOUS | Status: DC | PRN
  Filled 2015-03-24: qty 56

## 2015-03-24 MED ORDER — MISOPROSTOL 200 MCG PO TABS
1000.0000 ug | ORAL_TABLET | Freq: Once | ORAL | Status: AC
  Administered 2015-03-24: 1000 ug via RECTAL

## 2015-03-24 MED ORDER — MISOPROSTOL 200 MCG PO TABS
ORAL_TABLET | ORAL | Status: AC
  Filled 2015-03-24: qty 5

## 2015-03-24 MED ORDER — FERROUS SULFATE 325 (65 FE) MG PO TABS
325.0000 mg | ORAL_TABLET | Freq: Two times a day (BID) | ORAL | Status: DC
  Administered 2015-03-24 – 2015-03-25 (×3): 325 mg via ORAL
  Filled 2015-03-24 (×4): qty 1

## 2015-03-24 MED ORDER — OXYCODONE-ACETAMINOPHEN 5-325 MG PO TABS: 1.0000 | ORAL_TABLET | ORAL | Status: DC | PRN

## 2015-03-24 MED ORDER — OXYCODONE-ACETAMINOPHEN 5-325 MG PO TABS: 2.0000 | ORAL_TABLET | ORAL | Status: DC | PRN

## 2015-03-24 MED ORDER — METHYLERGONOVINE MALEATE 0.2 MG/ML IJ SOLN: 0.2000 mg | INTRAMUSCULAR | Status: DC | PRN

## 2015-03-24 NOTE — Anesthesia Postprocedure Evaluation (Signed)
  Anesthesia Post-op Note  Patient: Mindy [name]  Procedure(s) Performed: * No procedures listed *  Patient Location: Mother/Baby  Anesthesia Type:Epidural  Level of Consciousness: awake, alert  and oriented  Airway and Oxygen Therapy: Patient Spontanous Breathing  Post-op Pain: none  Post-op Assessment: Post-op Vital signs reviewed and Patient's Cardiovascular Status Stable              Post-op Vital Signs: Reviewed and stable  Last Vitals:  Filed Vitals:   03/24/15 0730  BP: 94/56  Pulse: 75  Temp: 36.9 C  Resp: 18    Complications: No apparent anesthesia complications

## 2015-03-24 NOTE — Anesthesia Preprocedure Evaluation (Signed)
Anesthesia Evaluation  Patient identified by MRN, date of birth, ID band Patient awake    Reviewed: Allergy & Precautions, NPO status , Patient's Chart, lab work & pertinent test results  History of Anesthesia Complications Negative for: history of anesthetic complications  Airway Mallampati: III  TM Distance: >3 FB Neck ROM: Full    Dental  (+) Teeth Intact   Pulmonary former smoker,  breath sounds clear to auscultation        Cardiovascular negative cardio ROS  Rhythm:Regular     Neuro/Psych negative neurological ROS  negative psych ROS   GI/Hepatic negative GI ROS, Neg liver ROS,   Endo/Other  negative endocrine ROS  Renal/GU negative Renal ROS     Musculoskeletal   Abdominal   Peds  Hematology negative hematology ROS (+)   Anesthesia Other Findings   Reproductive/Obstetrics (+) Pregnancy                             Anesthesia Physical Anesthesia Plan  ASA: II  Anesthesia Plan: Epidural   Post-op Pain Management:    Induction:   Airway Management Planned:   Additional Equipment:   Intra-op Plan:   Post-operative Plan:   Informed Consent: I have reviewed the patients History and Physical, chart, labs and discussed the procedure including the risks, benefits and alternatives for the proposed anesthesia with the patient or authorized representative who has indicated his/her understanding and acceptance.   Dental advisory given  Plan Discussed with: Anesthesiologist  Anesthesia Plan Comments:         Anesthesia Quick Evaluation

## 2015-03-24 NOTE — Progress Notes (Signed)
Pt wanted epidural turned down to feel the urge to push. Dr. Maple Hudson was in a case so patient wanted it off.

## 2015-03-24 NOTE — Progress Notes (Addendum)
  Subjective: No urge to push. Can feel intense ctxs on left side of abdomen when lying on right side w/ left leg elevated on peanut.  Objective: BP 98/54 mmHg  Pulse 70  Temp(Src) 98.3 F (36.8 C) (Oral)  Resp 18  Ht  (1.6 m)  Wt 90.266 kg (199 lb)  BMI 35.26 kg/m2  SpO2 100%    Today's Vitals   03/23/15 2330 03/24/15 0000 03/24/15 0030 03/24/15 0100  BP: 101/59 106/53 98/54   Pulse: 90 67 70 70  Temp:   98.3 F (36.8 C)   TempSrc:   Oral   Resp: Height:      Weight:      SpO2:      PainSc:       Total I/O In: -  Out: 150 [Urine:150]  FHT: Category 1 UC:   irregular, every 3-5 minutes SVE: C/C/+2 Pitocin at 7 mU/min  Assessment:  2nd stage labor Prolonged ROM Afebrile GBS positive - adequately treated Cat 1 FHRT  Plan: Trial push initiated - poor effort. Desires increase in Pitocin. If efforts not improved, desires to have Epidural turned down or off. Increase Pitocin. Expect SVD.  Sherre Scarlet CNM 03/24/2015, 1:03 AM   ADDENDUM: Still no urge to push despite Pitocin up to 13 mU/min and Epidural reduced in half by anesthesia. Per pt's request, Epidural d/c'd at 3 am w/ urge to push noted shortly thereafter.  Sherre Scarlet, CNM 03/24/15, 3:05 AM

## 2015-03-24 NOTE — Progress Notes (Signed)
Subjective: Postpartum Day 1: VBAC, R inner labial laceration Patient up ad lib, reports no syncope or dizziness. Feeding:  Breast Contraceptive plan:  Vasectomy  Objective: Vital signs in last 24 hours: Temp:  [97.2 F (36.2 C)-98.8 F (37.1 C)] 98.8 F (37.1 C) (08/14 1229) Pulse Rate:  [65-181] 74 (08/14 1229) Resp:  [16-20] 16 (08/14 1229) BP: (93-141)/(36-115) 113/63 mmHg (08/14 1229) SpO2:  [94 %-100 %] 100 % (08/13 2225)  Physical Exam:  General: alert Lochia: appropriate Uterine Fundus: firm Perineum: healing well DVT Evaluation: No evidence of DVT seen on physical exam.    Recent Labs  03/24/15 0645  HGB 10.6*  HCT 30.9*    Assessment/Plan: Status post vaginal delivery day 1 - successful VBAC. Stable Continue current care.     Nyra Capes 03/24/2015, 12:53 PM

## 2015-03-24 NOTE — Lactation Note (Signed)
This note was copied from the chart of Mindy [name]. Lactation Consultation Note  Breastfeeding consultation services and support information given and reviewed with patient.  Mom states she just weaned her 31 year old one month ago.  She states newborn latched within 30 minutes from delivery.  She states her private lactation consultant was here to assist her.  We discussed feeding with cues, obtaining deep latch, and using good waking techniques.  Encouraged to call with assist/concerns prn.  Patient Name: Mindy [name] Date: 03/24/2015 Reason for consult: Initial assessment   Maternal Data    Feeding Feeding Type: Breast Fed Length of feed: 15 min  LATCH Score/Interventions Latch: Grasps breast easily, tongue down, lips flanged, rhythmical sucking. Intervention(s): Breast compression  Audible Swallowing: A few with stimulation Intervention(s): Skin to skin  Type of Nipple: Everted at rest and after stimulation  Comfort (Breast/Nipple): Soft / non-tender     Hold (Positioning): No assistance needed to correctly position infant at breast.  LATCH Score: 9  Lactation Tools Discussed/Used     Consult Status Consult Status: Follow-up Date: 03/25/15 Follow-up type: In-patient    Huston Foley 03/24/2015, 10:19 AM

## 2015-03-24 NOTE — Anesthesia Procedure Notes (Signed)
Epidural Patient location during procedure: OB  Staffing Anesthesiologist: Charlot Gouin, CHRIS Performed by: anesthesiologist   Preanesthetic Checklist Completed: patient identified, surgical consent, pre-op evaluation, timeout performed, IV checked, risks and benefits discussed and monitors and equipment checked  Epidural Patient position: sitting Prep: DuraPrep Patient monitoring: heart rate, cardiac monitor, continuous pulse ox and blood pressure Approach: midline Location: L3-L4 Injection technique: LOR saline  Needle:  Needle type: Tuohy  Needle gauge: 17 G Needle length: 9 cm Needle insertion depth: 7 cm Catheter type: closed end flexible Catheter size: 19 Gauge Catheter at skin depth: 13 cm Test dose: negative and 2% lidocaine with Epi 1:200 K  Assessment Events: blood not aspirated, injection not painful, no injection resistance, negative IV test and no paresthesia  Additional Notes Reason for block:procedure for pain   

## 2015-03-24 NOTE — Progress Notes (Signed)
Dr. Maple Hudson restarted epidural at 0202 on 8 ml

## 2015-03-25 MED ORDER — IBUPROFEN 600 MG PO TABS: 600.0000 mg | ORAL_TABLET | Freq: Four times a day (QID) | ORAL | Status: DC | PRN

## 2015-03-25 NOTE — Lactation Note (Signed)
This note was copied from the chart of [name]. Lactation Consultation Note  Patient Name: [name] [id number] Date: 03/25/2015 Reason for consult: Follow-up assessment  Baby is 33 hours and mom requesting early D/C - baby is at 3 % weight loss  Breast feeding range - 10-20 mins , upto 90 mins . Voids and stools adequate for age. Latch scores 8-9-8 .  LC updated Doc flow sheets .  Per mom breast feeding is going well and breast are feeling fuller. Sore nipple and engorgement prevention and tx reviewed .  Per mom has a Private LC in the community and she will be visiting her at her home.  Mother informed of post-discharge support and given phone number to the lactation department, including services for phone call assistance; out-patient appointments; and breastfeeding support group. List of other breastfeeding resources in the community given in the handout. Encouraged mother to call for problems or concerns related to breastfeeding.   Maternal Data Has patient been taught Hand Expression?:  (per mom feels comfortable with hand expressing )  Feeding Feeding Type: Breast Fed Length of feed: 1 min (per mom )  LATCH Score/Interventions                      Lactation Tools Discussed/Used     Consult Status Consult Status: Complete Date: 03/25/15    Kathrin Greathouse 03/25/2015, 1:03 PM

## 2015-03-25 NOTE — Discharge Summary (Signed)
Vaginal Delivery Discharge Summary  Mindy [name]  DOB:    [birth year] MRN:    [id number] CSN:    [id number]  Date of admission:                  March 21, 2015   Date of discharge:                   March 23, 2015  Procedures this admission:   SVD with Right Labial Laceration  Date of Delivery: March 24, 2015  Newborn Data:  Live born female  Birth Weight: 7 lb 3 oz (3260 g) APGAR: 8, 10  Home with mother. Name: Mindy [name] Circumcision Plan: None  History of Present Illness:  Ms. Mindy [name] is a 29 y.o. female, G2P2002, who presents at [redacted]w[redacted]d weeks gestation. The patient has been followed at [facility] of [facility]. She was admitted rupture of membranes. Her pregnancy has been complicated by:  Patient Active Problem List   Diagnosis Date Noted  . Obstetric labial laceration, delivered, current hospitalization 03/25/2015  . VBAC, delivered 03/24/2015  . Vaginal delivery 03/24/2015  . Nipple lesion 07/20/12 07/21/2012  . S/P cesarean section 03/12/2012  . Depression 11/13/2011     Hospital Course:  Admitted for SROM. Positive GBS. Patient initially requested and was supported for expectant management.  However, after 50 hrs of SROM, patient started on low dose pitocin for augementation . Utilized epidural for pain management.  Delivery was performed by K. Mayford Knife, CNM without complication. Patient and baby tolerated the procedure without difficulty, with  labial laceration noted. Infant status was stable and remained in room with mother.  Mother and infant then had an uncomplicated postpartum course, with breast feeding going well. Mom's physical exam was WNL, and she was discharged home in stable condition on Day one per patient request. Contraception plan was vascetomy.  She received adequate benefit from po pain medications.   Feeding:  breast  Contraception:  vasectomy  Hemoglobin  Results:  CBC Latest Ref Rng 03/24/2015 03/21/2015 03/12/2012  WBC 4.0 - 10.5 K/uL 17.4(H) 11.1(H) 12.1(H)  Hemoglobin 12.0 - 15.0 g/dL 10.6(L) 12.0 11.1(L)  Hematocrit 36.0 - 46.0 % 30.9(L) 35.7(L) 33.5(L)  Platelets 150 - 400 K/uL 243 281 250     Discharge Physical Exam:   General: alert, cooperative and no distress Mood/Affect: Appropriate/Bright Chest: clear to auscultation, no wheezes, rales or rhonchi, symmetric air entry.  CVS exam: normal rate and regular rhythm. Breast exam: not examined. Abdomen:  + bowel sounds, Soft, NT Uterine Fundus: firm, U/-1 Lochia: appropriate Laceration: Not examined DVT Evaluation: No evidence of DVT seen on physical exam. No significant calf/ankle edema. Skin exam: Warm, Dry.  Procedures &/or Complications: Intrapartum Procedures: spontaneous vaginal delivery, GBS prophylaxis and Successful VBAC Postpartum Procedures: Methergine Series Complications-Operative and Postpartum: labial laceration   Discharge Information:  Diagnoses: Term Pregnancy-delivered, PROM x60+ hours and Succussful VBAC Activity:  pelvic rest Diet:   routine Medications: Ibuprofen Condition: stable Instructions:  Pain Management, Peri-Care, Breastfeeding, Who and When to call for postpartum complications. Information Sheet(s) given Postpartum Depression Discharge to: home  Follow-up Information    Follow up with [facility]. Schedule an appointment as soon as possible for a visit in 6 weeks.   Specialty:  Obstetrics and Gynecology   Why:  Please call if you have any questions or concerns, prior to your next visit.   Contact information:   [address] [place] [address] [place] [address] [phone]       Marlene Bast MSN, CNM 03/25/2015 11:42 AM

## 2015-03-25 NOTE — Discharge Instructions (Signed)
Postpartum Depression and Baby Blues The postpartum period begins right after the birth of a baby. During this time, there is often a great amount of joy and excitement. It is also a time of many changes in the life of the parents. Regardless of how many times a mother gives birth, each child brings new challenges and dynamics to the family. It is not unusual to have feelings of excitement along with confusing shifts in moods, emotions, and thoughts. All mothers are at risk of developing postpartum depression or the "baby blues." These mood changes can occur right after giving birth, or they may occur many months after giving birth. The baby blues or postpartum depression can be mild or severe. Additionally, postpartum depression can go away rather quickly, or it can be a long-term condition.  CAUSES Raised hormone levels and the rapid drop in those levels are thought to be a main cause of postpartum depression and the baby blues. A number of hormones change during and after pregnancy. Estrogen and progesterone usually decrease right after the delivery of your baby. The levels of thyroid hormone and various cortisol steroids also rapidly drop. Other factors that play a role in these mood changes include major life events and genetics.  RISK FACTORS If you have any of the following risks for the baby blues or postpartum depression, know what symptoms to watch out for during the postpartum period. Risk factors that may increase the likelihood of getting the baby blues or postpartum depression include:  Having a personal or family history of depression.   Having depression while being pregnant.   Having premenstrual mood issues or mood issues related to oral contraceptives.  Having a lot of life stress.   Having marital conflict.   Lacking a social support network.   Having a baby with special needs.   Having health problems, such as diabetes.  SIGNS AND SYMPTOMS Symptoms of baby blues  include:  Brief changes in mood, such as going from extreme happiness to sadness.  Decreased concentration.   Difficulty sleeping.   Crying spells, tearfulness.   Irritability.   Anxiety.  Symptoms of postpartum depression typically begin within the first month after giving birth. These symptoms include:  Difficulty sleeping or excessive sleepiness.   Marked weight loss.   Agitation.   Feelings of worthlessness.   Lack of interest in activity or food.  Postpartum psychosis is a very serious condition and can be dangerous. Fortunately, it is rare. Displaying any of the following symptoms is cause for immediate medical attention. Symptoms of postpartum psychosis include:   Hallucinations and delusions.   Bizarre or disorganized behavior.   Confusion or disorientation.  DIAGNOSIS  A diagnosis is made by an evaluation of your symptoms. There are no medical or lab tests that lead to a diagnosis, but there are various questionnaires that a health care provider may use to identify those with the baby blues, postpartum depression, or psychosis. Often, a screening tool called the Edinburgh Postnatal Depression Scale is used to diagnose depression in the postpartum period.  TREATMENT The baby blues usually goes away on its own in 1-2 weeks. Social support is often all that is needed. You will be encouraged to get adequate sleep and rest. Occasionally, you may be given medicines to help you sleep.  Postpartum depression requires treatment because it can last several months or longer if it is not treated. Treatment may include individual or group therapy, medicine, or both to address any social, physiological, and psychological   factors that may play a role in the depression. Regular exercise, a healthy diet, rest, and social support may also be strongly recommended.  Postpartum psychosis is more serious and needs treatment right away. Hospitalization is often needed. HOME CARE  INSTRUCTIONS  Get as much rest as you can. Nap when the baby sleeps.   Exercise regularly. Some women find yoga and walking to be beneficial.   Eat a balanced and nourishing diet.   Do little things that you enjoy. Have a cup of tea, take a bubble bath, read your favorite magazine, or listen to your favorite music.  Avoid alcohol.   Ask for help with household chores, cooking, grocery shopping, or running errands as needed. Do not try to do everything.   Talk to people close to you about how you are feeling. Get support from your partner, family members, friends, or other new moms.  Try to stay positive in how you think. Think about the things you are grateful for.   Do not spend a lot of time alone.   Only take over-the-counter or prescription medicine as directed by your health care provider.  Keep all your postpartum appointments.   Let your health care provider know if you have any concerns.  SEEK MEDICAL CARE IF: You are having a reaction to or problems with your medicine. SEEK IMMEDIATE MEDICAL CARE IF:  You have suicidal feelings.   You think you may harm the baby or someone else. MAKE SURE YOU:  Understand these instructions.  Will watch your condition.  Will get help right away if you are not doing well or get worse. Document Released: 04/30/2004 Document Revised: 08/01/2013 Document Reviewed: 05/08/2013 ExitCare Patient Information 2015 [employer]. This information is not intended to replace advice given to you by your health care provider. Make sure you discuss any questions you have with your health care provider.  

## 2016-02-25 DIAGNOSIS — R51 Headache: Secondary | ICD-10-CM | POA: Diagnosis not present

## 2016-02-25 DIAGNOSIS — N926 Irregular menstruation, unspecified: Secondary | ICD-10-CM | POA: Diagnosis not present

## 2016-09-09 DIAGNOSIS — Z23 Encounter for immunization: Secondary | ICD-10-CM | POA: Diagnosis not present

## 2016-09-09 DIAGNOSIS — B349 Viral infection, unspecified: Secondary | ICD-10-CM | POA: Diagnosis not present

## 2016-09-25 DIAGNOSIS — H1013 Acute atopic conjunctivitis, bilateral: Secondary | ICD-10-CM | POA: Diagnosis not present

## 2016-10-05 DIAGNOSIS — N3001 Acute cystitis with hematuria: Secondary | ICD-10-CM | POA: Diagnosis not present

## 2016-12-11 DIAGNOSIS — Z01419 Encounter for gynecological examination (general) (routine) without abnormal findings: Secondary | ICD-10-CM | POA: Diagnosis not present

## 2016-12-11 DIAGNOSIS — Z6828 Body mass index (BMI) 28.0-28.9, adult: Secondary | ICD-10-CM | POA: Diagnosis not present

## 2016-12-11 DIAGNOSIS — Z124 Encounter for screening for malignant neoplasm of cervix: Secondary | ICD-10-CM | POA: Diagnosis not present

## 2016-12-11 DIAGNOSIS — R102 Pelvic and perineal pain: Secondary | ICD-10-CM | POA: Diagnosis not present

## 2017-01-13 DIAGNOSIS — J029 Acute pharyngitis, unspecified: Secondary | ICD-10-CM | POA: Diagnosis not present

## 2017-02-24 DIAGNOSIS — R21 Rash and other nonspecific skin eruption: Secondary | ICD-10-CM | POA: Diagnosis not present

## 2017-06-07 DIAGNOSIS — S161XXA Strain of muscle, fascia and tendon at neck level, initial encounter: Secondary | ICD-10-CM | POA: Diagnosis not present

## 2017-06-07 DIAGNOSIS — S29019A Strain of muscle and tendon of unspecified wall of thorax, initial encounter: Secondary | ICD-10-CM | POA: Diagnosis not present

## 2017-06-07 DIAGNOSIS — S39012A Strain of muscle, fascia and tendon of lower back, initial encounter: Secondary | ICD-10-CM | POA: Diagnosis not present

## 2017-06-07 DIAGNOSIS — R079 Chest pain, unspecified: Secondary | ICD-10-CM | POA: Diagnosis not present

## 2017-06-15 DIAGNOSIS — R0782 Intercostal pain: Secondary | ICD-10-CM | POA: Diagnosis not present

## 2017-06-15 DIAGNOSIS — J45909 Unspecified asthma, uncomplicated: Secondary | ICD-10-CM | POA: Diagnosis not present

## 2017-06-15 DIAGNOSIS — N926 Irregular menstruation, unspecified: Secondary | ICD-10-CM | POA: Diagnosis not present

## 2017-12-13 DIAGNOSIS — F4323 Adjustment disorder with mixed anxiety and depressed mood: Secondary | ICD-10-CM | POA: Diagnosis not present

## 2018-02-03 DIAGNOSIS — F4323 Adjustment disorder with mixed anxiety and depressed mood: Secondary | ICD-10-CM | POA: Diagnosis not present

## 2018-03-22 DIAGNOSIS — F4323 Adjustment disorder with mixed anxiety and depressed mood: Secondary | ICD-10-CM | POA: Diagnosis not present

## 2018-04-06 DIAGNOSIS — F4323 Adjustment disorder with mixed anxiety and depressed mood: Secondary | ICD-10-CM | POA: Diagnosis not present

## 2018-04-18 DIAGNOSIS — F4323 Adjustment disorder with mixed anxiety and depressed mood: Secondary | ICD-10-CM | POA: Diagnosis not present

## 2018-04-29 DIAGNOSIS — F4323 Adjustment disorder with mixed anxiety and depressed mood: Secondary | ICD-10-CM | POA: Diagnosis not present

## 2018-05-22 DIAGNOSIS — J019 Acute sinusitis, unspecified: Secondary | ICD-10-CM | POA: Diagnosis not present

## 2018-05-22 DIAGNOSIS — H6692 Otitis media, unspecified, left ear: Secondary | ICD-10-CM | POA: Diagnosis not present

## 2018-06-06 DIAGNOSIS — F4323 Adjustment disorder with mixed anxiety and depressed mood: Secondary | ICD-10-CM | POA: Diagnosis not present

## 2018-06-23 DIAGNOSIS — F4323 Adjustment disorder with mixed anxiety and depressed mood: Secondary | ICD-10-CM | POA: Diagnosis not present

## 2018-07-04 DIAGNOSIS — F4323 Adjustment disorder with mixed anxiety and depressed mood: Secondary | ICD-10-CM | POA: Diagnosis not present

## 2018-07-18 DIAGNOSIS — F4323 Adjustment disorder with mixed anxiety and depressed mood: Secondary | ICD-10-CM | POA: Diagnosis not present

## 2018-08-25 DIAGNOSIS — F419 Anxiety disorder, unspecified: Secondary | ICD-10-CM | POA: Diagnosis not present

## 2018-09-15 DIAGNOSIS — F419 Anxiety disorder, unspecified: Secondary | ICD-10-CM | POA: Diagnosis not present

## 2018-09-20 DIAGNOSIS — R229 Localized swelling, mass and lump, unspecified: Secondary | ICD-10-CM | POA: Diagnosis not present

## 2018-12-02 DIAGNOSIS — M67441 Ganglion, right hand: Secondary | ICD-10-CM | POA: Diagnosis not present

## 2018-12-29 DIAGNOSIS — F4323 Adjustment disorder with mixed anxiety and depressed mood: Secondary | ICD-10-CM | POA: Diagnosis not present

## 2019-01-11 DIAGNOSIS — F4323 Adjustment disorder with mixed anxiety and depressed mood: Secondary | ICD-10-CM | POA: Diagnosis not present

## 2019-01-27 DIAGNOSIS — F4323 Adjustment disorder with mixed anxiety and depressed mood: Secondary | ICD-10-CM | POA: Diagnosis not present

## 2019-02-07 DIAGNOSIS — F4323 Adjustment disorder with mixed anxiety and depressed mood: Secondary | ICD-10-CM | POA: Diagnosis not present

## 2019-02-21 DIAGNOSIS — J45909 Unspecified asthma, uncomplicated: Secondary | ICD-10-CM | POA: Diagnosis not present

## 2019-02-21 DIAGNOSIS — Z0000 Encounter for general adult medical examination without abnormal findings: Secondary | ICD-10-CM | POA: Diagnosis not present

## 2019-02-21 DIAGNOSIS — Z13220 Encounter for screening for lipoid disorders: Secondary | ICD-10-CM | POA: Diagnosis not present

## 2019-02-21 DIAGNOSIS — Z124 Encounter for screening for malignant neoplasm of cervix: Secondary | ICD-10-CM | POA: Diagnosis not present

## 2019-02-22 DIAGNOSIS — F4323 Adjustment disorder with mixed anxiety and depressed mood: Secondary | ICD-10-CM | POA: Diagnosis not present

## 2019-03-01 DIAGNOSIS — F4323 Adjustment disorder with mixed anxiety and depressed mood: Secondary | ICD-10-CM | POA: Diagnosis not present

## 2019-03-20 DIAGNOSIS — F4323 Adjustment disorder with mixed anxiety and depressed mood: Secondary | ICD-10-CM | POA: Diagnosis not present

## 2019-04-13 DIAGNOSIS — F419 Anxiety disorder, unspecified: Secondary | ICD-10-CM | POA: Diagnosis not present

## 2019-04-27 DIAGNOSIS — F419 Anxiety disorder, unspecified: Secondary | ICD-10-CM | POA: Diagnosis not present

## 2019-05-17 DIAGNOSIS — F419 Anxiety disorder, unspecified: Secondary | ICD-10-CM | POA: Diagnosis not present

## 2019-06-02 ENCOUNTER — Other Ambulatory Visit: Payer: Self-pay

## 2019-06-02 DIAGNOSIS — Z20822 Contact with and (suspected) exposure to covid-19: Secondary | ICD-10-CM

## 2019-06-03 LAB — NOVEL CORONAVIRUS, NAA: SARS-CoV-2, NAA: NOT DETECTED

## 2019-06-15 DIAGNOSIS — F419 Anxiety disorder, unspecified: Secondary | ICD-10-CM | POA: Diagnosis not present

## 2019-07-20 DIAGNOSIS — F419 Anxiety disorder, unspecified: Secondary | ICD-10-CM | POA: Diagnosis not present

## 2019-08-17 DIAGNOSIS — F419 Anxiety disorder, unspecified: Secondary | ICD-10-CM | POA: Diagnosis not present

## 2019-08-29 ENCOUNTER — Ambulatory Visit: Payer: BLUE CROSS/BLUE SHIELD | Attending: Internal Medicine

## 2019-08-29 DIAGNOSIS — Z20822 Contact with and (suspected) exposure to covid-19: Secondary | ICD-10-CM | POA: Diagnosis not present

## 2019-08-30 LAB — NOVEL CORONAVIRUS, NAA: SARS-CoV-2, NAA: NOT DETECTED

## 2019-09-15 DIAGNOSIS — F419 Anxiety disorder, unspecified: Secondary | ICD-10-CM | POA: Diagnosis not present

## 2019-10-06 DIAGNOSIS — I7300 Raynaud's syndrome without gangrene: Secondary | ICD-10-CM | POA: Diagnosis not present

## 2019-10-06 DIAGNOSIS — J45909 Unspecified asthma, uncomplicated: Secondary | ICD-10-CM | POA: Diagnosis not present

## 2019-10-13 ENCOUNTER — Ambulatory Visit: Payer: BLUE CROSS/BLUE SHIELD | Attending: Internal Medicine

## 2019-10-13 DIAGNOSIS — Z20822 Contact with and (suspected) exposure to covid-19: Secondary | ICD-10-CM

## 2019-10-13 DIAGNOSIS — F4323 Adjustment disorder with mixed anxiety and depressed mood: Secondary | ICD-10-CM | POA: Diagnosis not present

## 2019-10-13 DIAGNOSIS — F419 Anxiety disorder, unspecified: Secondary | ICD-10-CM | POA: Diagnosis not present

## 2019-10-14 LAB — NOVEL CORONAVIRUS, NAA: SARS-CoV-2, NAA: NOT DETECTED

## 2019-11-17 DIAGNOSIS — F4323 Adjustment disorder with mixed anxiety and depressed mood: Secondary | ICD-10-CM | POA: Diagnosis not present

## 2019-11-21 DIAGNOSIS — Z124 Encounter for screening for malignant neoplasm of cervix: Secondary | ICD-10-CM | POA: Diagnosis not present

## 2019-11-21 DIAGNOSIS — Z6828 Body mass index (BMI) 28.0-28.9, adult: Secondary | ICD-10-CM | POA: Diagnosis not present

## 2019-11-21 DIAGNOSIS — N898 Other specified noninflammatory disorders of vagina: Secondary | ICD-10-CM | POA: Diagnosis not present

## 2019-11-21 DIAGNOSIS — Z01419 Encounter for gynecological examination (general) (routine) without abnormal findings: Secondary | ICD-10-CM | POA: Diagnosis not present

## 2019-12-15 DIAGNOSIS — F4323 Adjustment disorder with mixed anxiety and depressed mood: Secondary | ICD-10-CM | POA: Diagnosis not present

## 2023-06-08 ENCOUNTER — Other Ambulatory Visit (INDEPENDENT_AMBULATORY_CARE_PROVIDER_SITE_OTHER): Payer: Managed Care, Other (non HMO)

## 2023-06-08 ENCOUNTER — Ambulatory Visit (INDEPENDENT_AMBULATORY_CARE_PROVIDER_SITE_OTHER): Payer: Managed Care, Other (non HMO) | Admitting: Sports Medicine

## 2023-06-08 DIAGNOSIS — S43431A Superior glenoid labrum lesion of right shoulder, initial encounter: Secondary | ICD-10-CM

## 2023-06-08 DIAGNOSIS — M25511 Pain in right shoulder: Secondary | ICD-10-CM

## 2023-06-08 DIAGNOSIS — G8929 Other chronic pain: Secondary | ICD-10-CM

## 2023-06-08 NOTE — Progress Notes (Signed)
GENEIEVE [name] - 37 y.o. female MRN [id number]  Date of birth: 11-Apr-1986  Office Visit Note: Visit Date: 06/08/2023 PCP: Collene Mares, PA Referred by: Collene Mares, PA  Subjective: Chief Complaint  Patient presents with   Right Shoulder - Pain   HPI: Mindy [name] is a very pleasant 37 y.o. female who presents today for chronic right shoulder pain x few years.  She has had right shoulder pain for a couple of years.  This started around COVID time when she popped a rib which has improved on its own but her shoulder pain still remains.  In the past she did do a number of months of personal physical therapy, which she states helped quite a bit.  She also has been seeing a chiropractor with adjustments and is feeling somewhat better but she is still having episodes of popping and pain within the shoulder. She has taken over-the-counter anti-inflammatories in the past with mild relief.  He is active with yoga and other physical activity.  Pertinent ROS were reviewed with the patient and found to be negative unless otherwise specified above in HPI.   Assessment & Plan: Visit Diagnoses:  1. Chronic right shoulder pain   2. Tear of right glenoid labrum, initial encounter    Plan: I had a good discussion with Mindy [name] today regarding the likely nature of her chronic right shoulder pain.  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 She does have some mild pain around the [facility] joint and with subscapularis testing, however her strength is preserved and I think this is more compensatory in nature.  In the past she had responded well to formalized physical therapy.  We did discuss that with degenerative labral tearing, the first step is usually nonoperative with physical therapy, medications as needed and possible injection therapy.  She will resume her rehab exercises on a daily basis.  We did discuss considering a one-time  ultrasound-guided glenohumeral joint injection with either corticosteroid or PRP.  I did briefly discuss PRP and gave her handout to read about this.  She will take some time to review her options and then will message or call me to discuss next steps moving forward.  Did discuss that ultimately if conservative treatment options are not improving, further evaluation would be with a MRI arthrogram to confirm labral and intra-articular pathology.  Surgery is certainly an option if labral tearing was confirmed but would certainly proceed non-operative treatment first.  She is agreeable and understanding.  Follow-up: Return for She will message me after review to discuss next steps.   Meds & Orders: No orders of the defined types were placed in this encounter.   Orders Placed This Encounter  Procedures   XR Shoulder Right     Procedures: No procedures performed      Clinical History: No specialty comments available.  She reports that she has quit smoking. Her smoking use included cigarettes. She has a 3 pack-year smoking history. She has never used smokeless tobacco. No results for input(s): "HGBA1C", "LABURIC" in the last 8760 hours.  Objective:    Physical Exam  Gen: Well-appearing, in no acute distress; non-toxic CV: Well-perfused. Warm.  Resp: Breathing unlabored on room air; no wheezing. Psych: Fluid speech in conversation; appropriate affect; normal thought process Neuro: Sensation intact throughout. No gross coordination deficits.   Ortho Exam - Right shoulder: Mild TTP over the [facility] joint.  There is no redness swelling or effusion  about the shoulder.  Full active and passive range of motion.  She is actually somewhat hypermobile of the right shoulder joint specifically with external rotation.  There is 5/5 strength of rotator cuff testing.  Positive crossarm adduction, positive O'Brien's testing.  There is some pain with Gerber liftoff although strength preserved.  There is some  instability with apprehension and labral shearing test.  Imaging: XR Shoulder Right  Result Date: 06/08/2023 4 views of the right shoulder including AP, Grashey, scapular Y and axial view were ordered and reviewed by myself.  Humeral head is well located within the joint.  There is no notable arthritic change or bony abnormality.   Past Medical/Family/Surgical/Social History: Medications & Allergies reviewed per EMR, new medications updated. Patient Active Problem List   Diagnosis Date Noted   Obstetric labial laceration, delivered, current hospitalization 03/25/2015   VBAC, delivered 03/24/2015   Vaginal delivery 03/24/2015   Nipple lesion 07/20/12 07/21/2012   S/P cesarean section 03/12/2012   Depression 11/13/2011   Past Medical History:  Diagnosis Date   Asthma    Bacterial infection    Depression    History of chicken pox    Yeast infection    No family history on file. Past Surgical History:  Procedure Laterality Date   CESAREAN SECTION  03/11/2012   Procedure: CESAREAN SECTION;  Surgeon: Hal Morales, MD;  Location: [facility];  Service: Gynecology;  Laterality: N/A;   WISDOM TOOTH EXTRACTION     Social History   Occupational History   Not on file  Tobacco Use   Smoking status: Former    Current packs/day: 1.00    Average packs/day: 1 pack/day for 3.0 years (3.0 ttl pk-yrs)    Types: Cigarettes   Smokeless tobacco: Never  Substance and Sexual Activity   Alcohol use: No   Drug use: No   Sexual activity: Yes    Birth control/protection: None

## 2023-06-08 NOTE — Progress Notes (Signed)
Patient says that she has had pain and popping in the right shoulder for a couple of years. She says that she popped a rib around COVID time, and while that has been feeling better her shoulder pain has remained. She says that she did physical therapy which helped but has not continued with exercises on her own. She says that she goes to a chiropractor; she has seen them twice in the last month so her shoulder is feeling better but still popping, tight, and bothersome.

## 2023-06-08 NOTE — Patient Instructions (Addendum)
Mindy [name] - very nice to meet you today.  We talked about your shoulder pain.  Your x-rays are reassuring against any bony abnormality.  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  The only way to know with 100% certainty would be obtaining an MRI of the shoulder, but we will hold on this for now.  You do have some tenderness over your [facility] joint, but I think this is more compensatory pain from your shoulder/labrum.  Things you will do: - Be consistent with your physical therapy, once daily -We could consider performing an injection 1 time into the shoulder.  We discussed this either the cortisone or a PRP injection.  I will let you review the information below.  We would need to wait at least a week to 10 days after your COVID/flu vaccine. - you  may message or call to discuss more or schedule this   Dr. Shon Baton' Instructions and What to Expect for PRP Injections:  Platelet-rich plasma is used in musculoskeletal medicine to focus your own body's ability to heal. It has several well-done published research trials (RCT) which demonstrate both its effectiveness and safety in many musculoskeletal conditions, including osteoarthritis, tendinopathies, partial tendon tears, and damaged vertebral discs. PRP has been in clinical use since the 1990's. Many people know that platelets form a clot if there is a cut in the skin. It turns out that platelets do not only form a clot, but they also start the body's own repair process. When platelets activate to form a clot, they release alpha granules which have hundreds of chemical messengers in them that initiate and organize repair to the damaged tissue. Precisely placing PRP into the site of injury will initiate the healing process by activating on the damaged cartilage, bone or tendon. This is an inflammatory process, and inflammation is the vital first phase of healing.  What to expect and how  to prepare for PRP:   Depending on the procedure, you may need to arrange for a driver to bring you home (i.e. procedure on right/driving leg). IF you are having a lower extremity procedure, we can provide crutches as needed, but these are not routinely needed.   10 days prior to the procedure: Stop taking anti-inflammatory drugs like Ibuprofen/Motrin, Advil, Naprosyn, Celebrex, or Meloxicam. Even aspirin should be stopped (but need to discuss this with Dr. Shon Baton and your cardiologist beforehand). Let Dr. Shon Baton know if you have been taking prednisone or other corticosteroids in the last 30 days as this can negatively interfere with the PRP process.   The day before the procedure: thoroughly shower and clean your skin.    The day of the procedure: Wear loose-fitting clothing like sweatpants or shorts. If you are having an upper body procedure wear a top that can button or zip up. Please show up at least 15 minutes early for your appointment, as we will need to take you back to draw your blood prior to the procedure.    Things to avoid prior to procedure: tobacco/nicotine, alcohol, fatty foods. Tobacco is a potent toxin and its use constricts small blood vessels which are needed for tissue repair. Tobacco/nicotine use will limit the effectiveness of any treatment and stopping tobacco use is one of the single greatest actions you can take to improve your health. Avoid toxins like alcohol, which inhibits and depresses the cells needed for tissue repair.  PRP will initiate  healing and a productive inflammation, and PRP therapy will make the body part treated sore for the first 3-4 days up to two weeks. Anti-inflammatory drugs (i.e. ibuprofen, Naprosyn, Celebrex) and corticosteroids such as prednisone can blunt or stop this process, so it is important to not take any anti-inflammatory drugs for 10 days before getting PRP therapy, or for at least two weeks after PRP therapy.   Depending on the body part  injected, you may be in a sling or on crutches for several days. Most of the time these are not needed, but it is important to allow time for the body part to rest after the injection. By keeping the body part treated relaxed and not loading the body part the PRP can bind in place and do its job. If you push the body part too early, this may make the PRP less effective.  What happens during the PRP procedure?  Platelet rich plasma is made by taking some of your blood and performing a two-stage centrifuge process on it to concentrate the PRP. First, your blood is drawn into a syringe with a small amount of anti-coagulant in it (this is to keep the blood from clotting during this process). The amount of blood drawn is usually about 10-30 milliliters, depending on how much PRP is needed for the treatment. Then the blood is transferred in a sterile fashion into a centrifuge tube. It is then centrifuged for the first cycle where the red blood cells are isolated and discarded. In the second centrifuge cycle, the platelet-rich fraction of the remaining plasma is concentrated and placed in a syringe. The skin at the injection site is numbed with a small amount of topical cooling spray. Dr. Shon Baton will then precisely inject the PRP into the injury site using ultrasound guidance.  What to do after your procedure:   NO anti-inflammatories (Ibuprofen/Motrin, Aleve, Meloxicam, etc.) for 2 weeks after injection. It is OK to use Tylenol only if needed. I can also prescribe you specific medicine to control any discomfort you may have after the procedure if needed.   NO applying ice to the affected area for 2 weeks after the injection. It is OK to use heat.   Rest the affected body part. By keeping the body part treated relaxed and not loading the body part the PRP can bind in place and do its job. Be sure to ask Dr. Shon Baton specific post-injection rest and guidelines to follow following your injection, as these will  differ slightly depending on what type of PRP injection you receive. In general:   Allow 3-4 days of rest from physical labor or repetitive activity for the affected body part. It is ok to move the area, but no lifting or strenuous activity. After 3 days, unless otherwise instructed, the treated body part should be used and slowly moved through its full range of motion. It will be sore, but you will not damage it by moving it, in fact it needs to move to heal.   No strenuous activity, lifting weights, or dedicated therapy until the 2-week mark from the injection. After 2 weeks from the injection, this is when it is most advantageous to start physical therapy.   After two weeks, you may begin returning to activity, but it is a good idea to avoid activities that specifically hurt you before being treated. Exercise is vital to good health and finding a way to cross train around your injury is important not only for your physical health, but  your mental health as well. Ask me about cross training options for your injury. Some brief (10 minutes or less) period of heat therapy will not hurt the rehab, but it is not required. Usually, depending on the initial injury, physical therapy is started from two weeks to three weeks after injection. Improvements in pain and function should be expected from 6 weeks to 12 weeks after injection and some injuries may require more than one treatment.

## 2024-04-04 ENCOUNTER — Encounter: Payer: Self-pay | Admitting: Neurology

## 2024-04-04 ENCOUNTER — Ambulatory Visit (INDEPENDENT_AMBULATORY_CARE_PROVIDER_SITE_OTHER): Admitting: Neurology

## 2024-04-04 VITALS — BP 102/72 | HR 76 | Ht 63.0 in | Wt 151.0 lb

## 2024-04-04 DIAGNOSIS — G43001 Migraine without aura, not intractable, with status migrainosus: Secondary | ICD-10-CM | POA: Diagnosis not present

## 2024-04-04 MED ORDER — ONDANSETRON 8 MG PO TBDP: 8.0000 mg | ORAL_TABLET | Freq: Three times a day (TID) | ORAL | 11 refills | Status: AC | PRN

## 2024-04-04 NOTE — Progress Notes (Signed)
 [facility]    Provider:  Dr Ines Requesting Provider: Cleotilde, [place]  E, PA Primary Care Provider:  Cleotilde, [place]  E, PA  CC:  migraines  HPI:  Mindy [name] is a 38 y.o. female here as requested by Cleotilde, [place]  E, PA for migraines. has Depression; S/P cesarean section; Nipple lesion 07/20/12; VBAC, delivered; Vaginal delivery; Obstetric labial laceration, delivered, current hospitalization; and Migraine without aura and with status migrainosus, not intractable on their problem list.  She also has anemia, PCOS, severe constipation, back injury in 2004 fall, depression, Raynaud's phenomenon without gangrene, and asthma in adult without complication on her problem list.  I reviewed [place]  Miller's notes: Patient was seen for migraine without aura and without status migrainosus.  Per Dr. Dianne last notes June 07, 2019 for patient has a history of migraines, severe symptoms, denies aura, endorses associated nausea, reports to taking 1 ibuprofen  and 1 Tylenol , associated fatigue, thinks related to perimenopausal symptoms, 4 migraines in the last month, she was given sumatriptan as needed.  In May 2025 first, she reported her anxiety symptoms worsened, exacerbated depressive symptoms, lots of changes in the last year started a new basis and her husband started a new job working nights, contributed to her stress, started low-dose Prozac, at that time her asthma was stable, reported plantar fasciitis bilateral, seeing Ortho for pain in her right shoulder, situational stress started Prozac, on a vegetarian diet B12 was collected in the past and was vitamin D and not being followed,  Last time she had migraines was in the spring. She had one migraine in middle school and never had migraines since recently, a deep pressure inside her head, vomiting, severe, sumatriptan helped tremendously at onset, unclear onset/etiology, started in May becoming more severe and more  frequent the first few were the worst,lasted 4-5 hours and sleeping would help, ice, a head cap,  no medication overuse, no aura. Pulsating/pounding/throbbing, light and sound sensitivity, nausea, vomiting, hurts to move, migraines were moderate to severe, unilateral behind the left eye, no visual or sensory changes, the nausea and vomiting were awful, she had 8-10 in May, she had a few and went into the doctor and ran out of the sumatriptan.  Allergies may have triggered, the weather, stress and not sleeping and maybe changes in periods more hormonal, a daily allergy medication helped, resolved. Mother had horrible migraines. Short few weeks and then resolved, she went to a functional medicine doctor and she was placed on a multivitamin and some other supplements  Reviewed notes, labs and imaging from outside physicians, which showed:  From a thorough review of records and patient report, Medications tried that can be used in migraine/headache management greater than 3 months include: Lifestyle modification, headache diaries, better sleep hygiene, exercise, management of migraine triggers, OTC and prescribed analgesics/nsaids such as ibuprofen , excedrin, alleve and others, sumatriptan, prozac (amitriptyline and nortriptyline are contraindicated since patient is already on Prozac and the combination can cause serotonin syndrome), propranolol and other beta-blockers are contraindicated due to asthma, she also has severe constipation on her medical history would consider Aimovig contraindicated,  Labs June 07, 2023 include normal CMP with BUN 10 and creatinine 0.71, normal CBC, normal TSH.  Labs December 03, 2023 include normal CBC, normal iron panel, normal thyroid, low normal ferritin 27.9.  June 07, 2023 also collected: Vitamin D was low 21.3, B12 is low normal 208,   Review of Systems: Patient complains of symptoms per HPI as well as the following symptoms per  hpi. Pertinent negatives and positives  per HPI. All others negative.   Social History   Socioeconomic History   Marital status: Married    Spouse name: Not on file   Number of children: Not on file   Years of education: Not on file   Highest education level: Not on file  Occupational History   Not on file  Tobacco Use   Smoking status: Former    Current packs/day: 1.00    Average packs/day: 1 pack/day for 3.0 years (3.0 ttl pk-yrs)    Types: Cigarettes   Smokeless tobacco: Never  Vaping Use   Vaping status: Not on file  Substance and Sexual Activity   Alcohol use: Yes    Alcohol/week: 3.0 standard drinks of alcohol    Types: 1 Glasses of wine, 1 Cans of beer, 1 Shots of liquor per week   Drug use: No   Sexual activity: Yes    Birth control/protection: None  Other Topics Concern   Not on file  Social History Narrative   Pt lives with family    Pt works    Social Drivers of Corporate investment banker Strain: Not on file  Food Insecurity: Not on file  Transportation Needs: Not on file  Physical Activity: Not on file  Stress: Not on file  Social Connections: Unknown (12/23/2021)   Received from [facility]   Social Network    Social Network: Not on file  Intimate Partner Violence: Unknown (11/14/2021)   Received from [facility]   HITS    Physically Hurt: Not on file    Insult or Talk Down To: Not on file    Threaten Physical Harm: Not on file    Scream or Curse: Not on file    Family History  Problem Relation Age of Onset   Migraines Mother     Past Medical History:  Diagnosis Date   Asthma    Bacterial infection    Depression    History of chicken pox    Yeast infection     Patient Active Problem List   Diagnosis Date Noted   Migraine without aura and with status migrainosus, not intractable 04/04/2024   Obstetric labial laceration, delivered, current hospitalization 03/25/2015   VBAC, delivered 03/24/2015   Vaginal delivery 03/24/2015   Nipple lesion 07/20/12 07/21/2012   S/P  cesarean section 03/12/2012   Depression 11/13/2011    Past Surgical History:  Procedure Laterality Date   CESAREAN SECTION  03/11/2012   Procedure: CESAREAN SECTION;  Surgeon: Shanda SHAUNNA Muscat, MD;  Location: [facility];  Service: Gynecology;  Laterality: N/A;   WISDOM TOOTH EXTRACTION      Current Outpatient Medications  Medication Sig Dispense Refill   ondansetron  (ZOFRAN -ODT) 8 MG disintegrating tablet Take 1 tablet (8 mg total) by mouth every 8 (eight) hours as needed. For nausea. Can take with sumatriptan for migraine. Or alone for nausea. 20 tablet 11   Probiotic Product (PROBIOTIC DAILY PO) Take 1 tablet by mouth daily.      No current facility-administered medications for this visit.    Allergies as of 04/04/2024   (No Known Allergies)    Vitals: BP 102/72   Pulse 76   Ht 5' 3 (1.6 m)   Wt 151 lb (68.5 kg)   BMI 26.75 kg/m  Last Weight:  Wt Readings from Last 1 Encounters:  04/04/24 151 lb (68.5 kg)   Last Height:   Ht Readings from Last 1 Encounters:  04/04/24 5'  3 (1.6 m)     Physical exam: Exam: Gen: NAD, conversant, well nourised, well groomed                     CV: RRR, no MRG. No Carotid Bruits. No peripheral edema, warm, nontender Eyes: Conjunctivae clear without exudates or hemorrhage  Neuro: Detailed Neurologic Exam  Speech:    Speech is normal; fluent and spontaneous with normal comprehension.  Cognition:    The patient is oriented to person, place, and time;     recent and remote memory intact;     language fluent;     normal attention, concentration,     fund of knowledge Cranial Nerves:    The pupils are equal, round, and reactive to light. The fundi are normal and spontaneous venous pulsations are present. Visual fields are full to finger confrontation. Extraocular movements are intact. Trigeminal sensation is intact and the muscles of mastication are normal. The face is symmetric. The palate elevates in the midline. Hearing intact. Voice  is normal. Shoulder shrug is normal. The tongue has normal motion without fasciculations.   Coordination: nml  Gait: nml  Motor Observation:    No asymmetry, no atrophy, and no involuntary movements noted. Tone:    Normal muscle tone.    Posture:    Posture is normal. normal erect    Strength:    Strength is V/V in the upper and lower limbs.      Sensation: intact to LT     Reflex Exam:  DTR's:    Deep tendon reflexes in the upper and lower extremities are normal bilaterally.   Toes:    The toes are downgoing bilaterally.   Clonus:    Clonus is absent.    Assessment/Plan:  Patient with episodic migraines. She has a cluster of migraines in may unclear etiology, she had 8-10 in May, Allergies may have triggered, the weather, stress and not sleeping and maybe changes in periods more hormonal, a daily allergy medication helped, now resolved. Mother had horrible migraines. Short few weeks and then resolved, she went to a functional medicine doctor and she was placed on a multivitamin and some other supplements  Symptoms resolved and neuro exam normal so no indication for imaging but should this happen again at this severity and acuity would get mri brain asap Sumatriptan prn helped, can continue Zofran  for nausea  No orders of the defined types were placed in this encounter.  Meds ordered this encounter  Medications   ondansetron  (ZOFRAN -ODT) 8 MG disintegrating tablet    Sig: Take 1 tablet (8 mg total) by mouth every 8 (eight) hours as needed. For nausea. Can take with sumatriptan for migraine. Or alone for nausea.    Dispense:  20 tablet    Refill:  11    Cc: Miller, [place]  E, PA,  Miller, [place]  E, PA  Onetha Epp, MD  [facility] [address] [place], [place] [address]  Phone [phone] Fax [phone]
